# Patient Record
Sex: Female | Born: 1975
Health system: Southern US, Community
[De-identification: ages and names within clinical notes are randomized; demographics above are authoritative.]

## PROBLEM LIST (undated history)

## (undated) DIAGNOSIS — Z8669 Personal history of other diseases of the nervous system and sense organs: Secondary | ICD-10-CM

## (undated) DIAGNOSIS — Z87898 Personal history of other specified conditions: Secondary | ICD-10-CM

## (undated) DIAGNOSIS — M3500 Sicca syndrome, unspecified: Secondary | ICD-10-CM

## (undated) DIAGNOSIS — Z8659 Personal history of other mental and behavioral disorders: Secondary | ICD-10-CM

## (undated) HISTORY — PX: REDUCTION MAMMAPLASTY: SUR839

## (undated) HISTORY — DX: Personal history of other mental and behavioral disorders: Z86.59

## (undated) HISTORY — DX: Personal history of other specified conditions: Z87.898

## (undated) HISTORY — DX: Sjogren syndrome, unspecified: M35.00

## (undated) HISTORY — DX: Personal history of other diseases of the nervous system and sense organs: Z86.69

---

## 2002-11-08 HISTORY — PX: BREAST REDUCTION SURGERY: SHX8

## 2018-09-21 DIAGNOSIS — S5412XA Injury of median nerve at forearm level, left arm, initial encounter: Secondary | ICD-10-CM | POA: Insufficient documentation

## 2018-09-29 ENCOUNTER — Encounter: Payer: Self-pay | Admitting: Infectious Diseases

## 2018-11-09 DIAGNOSIS — H93299 Other abnormal auditory perceptions, unspecified ear: Secondary | ICD-10-CM | POA: Diagnosis not present

## 2018-11-09 DIAGNOSIS — F419 Anxiety disorder, unspecified: Secondary | ICD-10-CM | POA: Diagnosis not present

## 2018-11-09 DIAGNOSIS — Z79899 Other long term (current) drug therapy: Secondary | ICD-10-CM | POA: Diagnosis not present

## 2018-11-09 DIAGNOSIS — F902 Attention-deficit hyperactivity disorder, combined type: Secondary | ICD-10-CM | POA: Diagnosis not present

## 2018-11-09 DIAGNOSIS — F338 Other recurrent depressive disorders: Secondary | ICD-10-CM | POA: Diagnosis not present

## 2018-12-08 DIAGNOSIS — B9789 Other viral agents as the cause of diseases classified elsewhere: Secondary | ICD-10-CM | POA: Diagnosis not present

## 2018-12-08 DIAGNOSIS — J029 Acute pharyngitis, unspecified: Secondary | ICD-10-CM | POA: Diagnosis not present

## 2018-12-08 DIAGNOSIS — J028 Acute pharyngitis due to other specified organisms: Secondary | ICD-10-CM | POA: Diagnosis not present

## 2019-01-23 DIAGNOSIS — N870 Mild cervical dysplasia: Secondary | ICD-10-CM | POA: Diagnosis not present

## 2019-01-23 DIAGNOSIS — N898 Other specified noninflammatory disorders of vagina: Secondary | ICD-10-CM | POA: Diagnosis not present

## 2019-02-26 DIAGNOSIS — F419 Anxiety disorder, unspecified: Secondary | ICD-10-CM | POA: Diagnosis not present

## 2019-02-26 DIAGNOSIS — F902 Attention-deficit hyperactivity disorder, combined type: Secondary | ICD-10-CM | POA: Diagnosis not present

## 2019-02-26 DIAGNOSIS — F338 Other recurrent depressive disorders: Secondary | ICD-10-CM | POA: Diagnosis not present

## 2019-04-30 DIAGNOSIS — N870 Mild cervical dysplasia: Secondary | ICD-10-CM | POA: Diagnosis not present

## 2019-04-30 DIAGNOSIS — N760 Acute vaginitis: Secondary | ICD-10-CM | POA: Diagnosis not present

## 2019-04-30 DIAGNOSIS — Z113 Encounter for screening for infections with a predominantly sexual mode of transmission: Secondary | ICD-10-CM | POA: Diagnosis not present

## 2019-04-30 DIAGNOSIS — N939 Abnormal uterine and vaginal bleeding, unspecified: Secondary | ICD-10-CM | POA: Diagnosis not present

## 2019-04-30 DIAGNOSIS — N898 Other specified noninflammatory disorders of vagina: Secondary | ICD-10-CM | POA: Diagnosis not present

## 2019-05-04 ENCOUNTER — Encounter: Payer: Self-pay | Admitting: Family Medicine

## 2019-05-04 ENCOUNTER — Ambulatory Visit (INDEPENDENT_AMBULATORY_CARE_PROVIDER_SITE_OTHER): Payer: BC Managed Care – PPO | Admitting: Family Medicine

## 2019-05-04 ENCOUNTER — Other Ambulatory Visit: Payer: Self-pay

## 2019-05-04 VITALS — BP 116/78 | HR 77 | Temp 97.8°F | Ht 66.25 in | Wt 166.5 lb

## 2019-05-04 DIAGNOSIS — Z7689 Persons encountering health services in other specified circumstances: Secondary | ICD-10-CM | POA: Diagnosis not present

## 2019-05-04 DIAGNOSIS — D171 Benign lipomatous neoplasm of skin and subcutaneous tissue of trunk: Secondary | ICD-10-CM

## 2019-05-04 DIAGNOSIS — K649 Unspecified hemorrhoids: Secondary | ICD-10-CM

## 2019-05-04 MED ORDER — HYDROCORTISONE ACETATE 25 MG RE SUPP: 25.0000 mg | Freq: Two times a day (BID) | RECTAL | 0 refills | Status: DC

## 2019-05-04 NOTE — Patient Instructions (Addendum)
Good to see you today  I have sent in suppositories to your pharmacy. If no improvement, let me know. Continue good fiber and water intake.   Please call and schedule an appointment for screening mammogram. A referral is not needed.  [place]  [place]- [phone]

## 2019-05-04 NOTE — Progress Notes (Signed)
   Subjective:    Patient ID: Natalie Blair, female    DOB: Feb 01, 1976, 43 y.o.   MRN: [id number]  HPI This is a 43 yo female who presents today to establish care and with complaint of painful hemorrhoids x 10 days. 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   Last CPE- last year Mammo- last year Pap- last week  Eye- regular Dental- regular Exercise- a couple times week Stress- ok Sleep- 6 hours a night, more during weekends  Hemorrhoids- some hard bowel movements prior to hemorrhoids. Started after pregnancy. Has used preparation H, sitz baths, ice, tucks. Bowel movements better. No bleeding. Occurs infrequently.   She has an area of swelling at the base of her neck on the right side. Seems to be getting larger, no drainage, occasional pain if she puts pressure on it.   Past Medical History:  Diagnosis Date  . History of depression   . History of headache   . History of migraine    Past Surgical History:  Procedure Laterality Date  . BREAST REDUCTION SURGERY  2004   Family History  Problem Relation Age of Onset  . Depression Mother   . Heart disease Father   . Hypertension Father   . Stroke Father       Review of Systems Per HPI    Objective:     Physical Exam  Constitutional: She is oriented to person, place, and time. She appears well-developed and well-nourished. No distress.  HENT:  Head: Normocephalic and atraumatic.  Eyes: Conjunctivae are normal.  Cardiovascular: Normal rate.  Pulmonary/Chest: Effort normal.  Genitourinary: Rectum:     External hemorrhoid (approximately 1 cm, flesh colored, firm.) present.   Neurological: She is alert and oriented to person, place, and time.  Skin: Skin is warm and dry. She is not diaphoretic.     Psychiatric: She has a normal mood and affect. Her behavior is normal. Judgment and thought content normal.  Vitals reviewed.    BP 116/78 (BP Location: Left Arm, Patient Position: Sitting, Cuff Size:  Normal)   Pulse 77   Temp 97.8 F (36.6 C) (Temporal)   Ht 5' 6.25" (1.683 m)   Wt 166 lb 8 oz (75.5 kg)   LMP 04/17/2019   SpO2 100%   BMI 26.67 kg/m      Assessment & Plan:  1. Encounter to establish care - will request records from previous provider.  - she was provided information to schedule mammogram  2. Hemorrhoids, unspecified hemorrhoid type - discussed importance of regular, soft bowel movements and suggested improved fiber and water intake.  - hydrocortisone (ANUSOL-HC) 25 MG suppository; Place 1 suppository (25 mg total) rectally 2 (two) times daily.  Dispense: 12 suppository; Refill: 0  3. Lipoma of torso - Discussed options of monitoring or surgery referral to discuss removal. She elects to discuss removal with surgery, referral placed.  - Ambulatory referral to [place], FNP-BC  [facility]  05/08/2019 4:27 PM

## 2019-05-08 ENCOUNTER — Encounter: Payer: Self-pay | Admitting: Family Medicine

## 2019-05-09 ENCOUNTER — Encounter: Payer: Self-pay | Admitting: *Deleted

## 2019-05-09 ENCOUNTER — Encounter: Payer: Self-pay | Admitting: Surgery

## 2019-05-09 ENCOUNTER — Other Ambulatory Visit: Payer: Self-pay

## 2019-05-09 ENCOUNTER — Ambulatory Visit (INDEPENDENT_AMBULATORY_CARE_PROVIDER_SITE_OTHER): Payer: BC Managed Care – PPO | Admitting: Surgery

## 2019-05-09 VITALS — BP 112/68 | HR 75 | Temp 97.8°F | Ht 66.0 in | Wt 168.0 lb

## 2019-05-09 DIAGNOSIS — D170 Benign lipomatous neoplasm of skin and subcutaneous tissue of head, face and neck: Secondary | ICD-10-CM

## 2019-05-09 NOTE — Patient Instructions (Signed)
Lipoma  A lipoma is a noncancerous (benign) tumor that is made up of fat cells. This is a very common type of soft-tissue growth. Lipomas are usually found under the skin (subcutaneous). They may occur in any tissue of the body that contains fat. Common areas for lipomas to appear include the back, shoulders, buttocks, and thighs.  Lipomas grow slowly, and they are usually painless. Most lipomas do not cause problems and do not require treatment. What are the causes? The cause of this condition is not known. What increases the risk? You are more likely to develop this condition if:  You are 40-60 years old.  You have a family history of lipomas. What are the signs or symptoms? A lipoma usually appears as a small, round bump under the skin. In most cases, the lump will:  Feel soft or rubbery.  Not cause pain or other symptoms. However, if a lipoma is located in an area where it pushes on nerves, it can become painful or cause other symptoms. How is this diagnosed? A lipoma can usually be diagnosed with a physical exam. You may also have tests to confirm the diagnosis and to rule out other conditions. Tests may include:  Imaging tests, such as a CT scan or MRI.  Removal of a tissue sample to be looked at under a microscope (biopsy). How is this treated? Treatment for this condition depends on the size of the lipoma and whether it is causing any symptoms.  For small lipomas that are not causing problems, no treatment is needed.  If a lipoma is bigger or it causes problems, surgery may be done to remove the lipoma. Lipomas can also be removed to improve appearance. Most often, the procedure is done after applying a medicine that numbs the area (local anesthetic). Follow these instructions at home:  Watch your lipoma for any changes.  Keep all follow-up visits as told by your health care provider. This is important. Contact a health care provider if:  Your lipoma becomes larger or  hard.  Your lipoma becomes painful, red, or increasingly swollen. These could be signs of infection or a more serious condition. Get help right away if:  You develop tingling or numbness in an area near the lipoma. This could indicate that the lipoma is causing nerve damage. Summary  A lipoma is a noncancerous tumor that is made up of fat cells.  Most lipomas do not cause problems and do not require treatment.  If a lipoma is bigger or it causes problems, surgery may be done to remove the lipoma. This information is not intended to replace advice given to you by your health care provider. Make sure you discuss any questions you have with your health care provider. Document Released: 10/15/2002 Document Revised: 10/11/2017 Document Reviewed: 10/11/2017 Elsevier Patient Education  2020 Elsevier Inc[employer]  

## 2019-05-09 NOTE — Progress Notes (Unsigned)
Patient needs to have an excision of a posterior neck lipoma (6 cm approximate). The patient is concerned about the cost for this. Patient wishes to get a cost estimate of removal O.R. versus office.   Dr. Dahlia Byes would prefer to do in the O.R. If patient wishes to proceed with office excision we will need to allow one hour.   Note routed to Angie to please provise patient with an estimate.

## 2019-05-09 NOTE — Progress Notes (Signed)
Patient ID: Natalie Blair, female   DOB: [birth year], 43 y.o.   MRN: [id number]  HPI Natalie Blair is a 43 y.o. female asked to see in consultation at the request of this is Carlean Purl FNP.  He has had right posterior neck/shoulder mass for the last  Year or so.  She does report increasing in size that has been slowly.  She reports some intermittent mild pain that is dull and achy type of feeling.  No specific alleviating or aggravating factors.  No fevers no chills no evidence of B type symptoms.  No images are available at this time. Is able to perform more than 4 METS of activity without any shortness of breath or chest pain.  She is otherwise healthy    HPI  Past Medical History:  Diagnosis Date  . History of depression   . History of headache   . History of migraine     Past Surgical History:  Procedure Laterality Date  . BREAST REDUCTION SURGERY  2004    Family History  Problem Relation Age of Onset  . Depression Mother   . Heart disease Father   . Hypertension Father   . Stroke Father     Social History Social History   Tobacco Use  . Smoking status: Never Smoker  . Smokeless tobacco: Never Used  Substance Use Topics  . Alcohol use: Yes    Comment: socially/occ  . Drug use: Not Currently    No Known Allergies  Current Outpatient Medications  Medication Sig Dispense Refill  . Ascorbic Acid (VITAMIN C PO) Take 1 capsule by mouth daily.    . Cholecalciferol (VITAMIN D3 PO) Take 1 capsule by mouth daily.    . hydrocortisone (ANUSOL-HC) 25 MG suppository Place 1 suppository (25 mg total) rectally 2 (two) times daily. 12 suppository 0  . Probiotic Product (PROBIOTIC PO) Take 1 capsule by mouth daily.     Marland Kitchen VYVANSE 20 MG capsule Take 1 capsule by mouth daily.     No current facility-administered medications for this visit.      Review of Systems Full ROS  was asked and was negative except for the information on the HPI  Physical Exam Blood pressure 112/68, pulse  75, temperature 97.8 F (36.6 C), temperature source Skin, height 5\' 6"  (1.676 m), weight 168 lb (76.2 kg), last menstrual period 04/17/2019, SpO2 98 %. CONSTITUTIONAL: NAD EYES: Pupils are equal, round, and reactive to light, Sclera are non-icteric. EARS, NOSE, MOUTH AND THROAT: The oropharynx is clear. The oral mucosa is pink and moist. Hearing is intact to voice. LYMPH NODES:  Lymph nodes in the neck are normal. RESPIRATORY:  Lungs are clear. There is normal respiratory effort, with equal breath sounds bilaterally, and without pathologic use of accessory muscles. CARDIOVASCULAR: Heart is regular without murmurs, gallops, or rubs. GI: The abdomen is  soft, nontender, and nondistended. There are no palpable masses. There is no hepatosplenomegaly. There are normal bowel sounds in all quadrants. GU: Rectal deferred.   MUSCULOSKELETAL: Normal muscle strength and tone. No cyanosis or edema.   SKIN: Approximately 6 cm posterior right neck/shoulder mass that is mobile consistent with a deep lipoma  nEUROLOGIC: Motor and sensation is grossly normal. Cranial nerves are grossly intact. PSYCH:  Oriented to person, place and time. Affect is normal.  Data Reviewed  I have personally reviewed the patient's imaging, laboratory findings and medical records.    Assessment/Plan 43 year old female with a large symptomatic posterior neck lipoma.  Given her symptoms  and the increasing size to recommend excision.  Also discussed in detail about options or further imaging versus excision.  And I also stated to the patient that this will probably be ideal to be removed under general anesthetic given the location.  There is a chance that we might be able to do it under local in the office but these will be less than ideal.  Patient is very concerned about the cost of having this procedure done in the OR.  Procedure discussed with the patient in detail.  Risk benefit and possible occasions including but not limited to  bleeding, infection, nerve injuries.  Stands.  Patient will call back and let us know what is her preference, as I stated before I prefer to do this under general anesthetic in a prone position in the OR  A copy of this report was sent to the referring provider  Caroleen Hamman, MD [place] Surgeon 05/09/2019, 3:38 PM

## 2019-05-16 DIAGNOSIS — Z1159 Encounter for screening for other viral diseases: Secondary | ICD-10-CM | POA: Diagnosis not present

## 2019-05-16 DIAGNOSIS — N858 Other specified noninflammatory disorders of uterus: Secondary | ICD-10-CM | POA: Diagnosis not present

## 2019-05-16 DIAGNOSIS — N939 Abnormal uterine and vaginal bleeding, unspecified: Secondary | ICD-10-CM | POA: Diagnosis not present

## 2019-05-21 ENCOUNTER — Telehealth: Payer: Self-pay | Admitting: Surgery

## 2019-05-21 DIAGNOSIS — N920 Excessive and frequent menstruation with regular cycle: Secondary | ICD-10-CM | POA: Diagnosis not present

## 2019-05-21 DIAGNOSIS — N840 Polyp of corpus uteri: Secondary | ICD-10-CM | POA: Diagnosis not present

## 2019-05-21 NOTE — Telephone Encounter (Signed)
I have contacted the patient with estimate.   CPT: 97588 given per Selinda Eon.   Patient has met her deductible. Co insurance is 20% for outpatient and specialty office.   Patient's OOP max remaining is $1,262.67.  Office estimate-$97.97 Outpatient provider estimate-$81.04  Patient made aware that she would be billed for pathology separately if done in the office.   If done as outpatient, she would be billed for hospital, anesthesia and pathology.   She will contact the office to schedule once decision has been made.

## 2019-05-30 ENCOUNTER — Ambulatory Visit (INDEPENDENT_AMBULATORY_CARE_PROVIDER_SITE_OTHER): Payer: BC Managed Care – PPO | Admitting: Family Medicine

## 2019-05-30 ENCOUNTER — Encounter: Payer: Self-pay | Admitting: Family Medicine

## 2019-05-30 VITALS — Ht 66.25 in | Wt 165.0 lb

## 2019-05-30 DIAGNOSIS — Z9889 Other specified postprocedural states: Secondary | ICD-10-CM | POA: Diagnosis not present

## 2019-05-30 DIAGNOSIS — R5383 Other fatigue: Secondary | ICD-10-CM

## 2019-05-30 NOTE — Progress Notes (Signed)
Virtual Visit via Video Note  I connected with Natalie Blair on 05/30/19 at  2:00 PM EDT by a video enabled telemedicine application and verified that I am speaking with the correct person using two identifiers.  Location: Patient: In her home Provider: [place]   I discussed the limitations of evaluation and management by telemedicine and the availability of in person appointments. The patient expressed understanding and agreed to proceed.  History of Present Illness: This is a 43 yo female who requests virtual visit to discuss 3 days of severe fatigue.  She had a uterine ablation 8 days ago.  She reports that she did not have significant bleeding.  She had had a few cramps as expected.  She has not had any fever, headache, sore throat, ear pain, muscle aches or rash.   Past Medical History:  Diagnosis Date  . History of depression   . History of headache   . History of migraine    Past Surgical History:  Procedure Laterality Date  . BREAST REDUCTION SURGERY  2004   Family History  Problem Relation Age of Onset  . Depression Mother   . Heart disease Father   . Hypertension Father   . Stroke Father    Social History   Tobacco Use  . Smoking status: Never Smoker  . Smokeless tobacco: Never Used  Substance Use Topics  . Alcohol use: Yes    Comment: socially/occ  . Drug use: Not Currently    Observations/Objective: Patient is alert and answers questions appropriately.  Visible skin is unremarkable.  She is normally conversive without shortness of breath.  No audible wheeze or witnessed cough.  Mood and affect are appropriate. Ht 5' 6.25" (1.683 m)   Wt 165 lb (74.8 kg)   LMP 05/14/2019   BMI 26.43 kg/m  Wt Readings from Last 3 Encounters:  05/30/19 165 lb (74.8 kg)  05/09/19 168 lb (76.2 kg)  05/04/19 166 lb 8 oz (75.5 kg)    Assessment and Plan: 1. Fatigue, unspecified type - unclear etiology, will check labs, she was instructed to report any symptoms  that develop - CBC with Differential/Platelet; Future - Comprehensive metabolic panel; Future - TSH; Future - Ferritin; Future  2. S/P endometrial ablation - CBC with Differential/Platelet; Future   Natalie Reamer, FNP-BC  [facility]  05/30/2019 2:14 PM   Follow Up Instructions:    I discussed the assessment and treatment plan with the patient. The patient was provided an opportunity to ask questions and all were answered. The patient agreed with the plan and demonstrated an understanding of the instructions.   The patient was advised to call back or seek an in-person evaluation if the symptoms worsen or if the condition fails to improve as anticipated.   Elby Beck, FNP

## 2019-05-31 ENCOUNTER — Other Ambulatory Visit: Payer: BC Managed Care – PPO

## 2019-05-31 DIAGNOSIS — F338 Other recurrent depressive disorders: Secondary | ICD-10-CM | POA: Diagnosis not present

## 2019-05-31 DIAGNOSIS — F419 Anxiety disorder, unspecified: Secondary | ICD-10-CM | POA: Diagnosis not present

## 2019-05-31 DIAGNOSIS — Z79899 Other long term (current) drug therapy: Secondary | ICD-10-CM | POA: Diagnosis not present

## 2019-05-31 DIAGNOSIS — F902 Attention-deficit hyperactivity disorder, combined type: Secondary | ICD-10-CM | POA: Diagnosis not present

## 2019-06-07 DIAGNOSIS — Z30430 Encounter for insertion of intrauterine contraceptive device: Secondary | ICD-10-CM | POA: Diagnosis not present

## 2019-07-23 ENCOUNTER — Ambulatory Visit (INDEPENDENT_AMBULATORY_CARE_PROVIDER_SITE_OTHER): Payer: BC Managed Care – PPO | Admitting: Family Medicine

## 2019-07-23 ENCOUNTER — Encounter: Payer: Self-pay | Admitting: Family Medicine

## 2019-07-23 ENCOUNTER — Other Ambulatory Visit: Payer: Self-pay

## 2019-07-23 VITALS — BP 100/70 | HR 65 | Temp 98.3°F | Ht 66.25 in | Wt 164.5 lb

## 2019-07-23 DIAGNOSIS — H9201 Otalgia, right ear: Secondary | ICD-10-CM

## 2019-07-23 MED ORDER — OFLOXACIN 0.3 % OT SOLN: 10.0000 [drp] | Freq: Every day | OTIC | 0 refills | Status: DC

## 2019-07-23 MED ORDER — PREDNISONE 20 MG PO TABS: ORAL_TABLET | ORAL | 0 refills | Status: DC

## 2019-07-23 NOTE — Progress Notes (Signed)
Natalie Base T. Halley Kincer, MD Primary Care and [place] [facility] [place], [address] Phone: [phone]  FAX: [phone]  Natalie Blair - 43 y.o. female  MRN [id number]  Date of Birth: 1976-07-29  Visit Date: 07/23/2019  PCP: Elby Beck, FNP  Referred by: Elby Beck, FNP  Chief Complaint  Patient presents with  . Jaw Pain    Right x 2 weeks   Subjective:   Natalie Blair is a 43 y.o. very pleasant female patient who presents with the following:  Worse for 2 weeks.  Right pain at the corner of the jaw and getting worse.   The patient presents with a two-week history of pain at the corner of her jaw.  She has pain with opening her jaw and sometimes she does have mechanical click.  She also has pain around the tragus itself.  She has some pain in and around the ear as well but she does not have any maxillary pain or frontal sinus pain. Good tooth care.   Past Medical History, Surgical History, Social History, Family History, Problem List, Medications, and Allergies have been reviewed and updated if relevant.  Patient Active Problem List   Diagnosis Date Noted  . 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 History of depression   . History of headache   . History of migraine     Past Surgical History:  Procedure Laterality Date  . BREAST REDUCTION SURGERY  2004    Social History   Socioeconomic History  . Marital status: Single    Spouse name: Not on file  . Number of children: Not on file  . Years of education: Not on file  . Highest education level: Not on file  Occupational History  . Not on file  Social Needs  . Financial resource strain: Not on file  . Food insecurity    Worry: Not on file    Inability: Not on file  . Transportation needs    Medical: Not on file    Non-medical: Not on file  Tobacco Use  . Smoking  status: Never Smoker  . Smokeless tobacco: Never Used  Substance and Sexual Activity  . Alcohol use: Yes    Comment: socially/occ  . Drug use: Not Currently  . Sexual activity: Not on file  Lifestyle  . Physical activity    Days per week: Not on file    Minutes per session: Not on file  . Stress: Not on file  Relationships  . Social Herbalist on phone: Not on file    Gets together: Not on file    Attends religious service: Not on file    Active member of club or organization: Not on file    Attends meetings of clubs or organizations: Not on file    Relationship status: Not on file  . Intimate partner violence    Fear of current or ex partner: Not on file    Emotionally abused: Not on file    Physically abused: Not on file    Forced sexual activity: Not on file  Other Topics Concern  . Not on file  Social History Narrative  . Not on file    Family History  Problem Relation Age of Onset  . Depression Mother   . Heart disease Father   . Hypertension Father   .  Stroke Father     No Known Allergies  Medication list reviewed and updated in full in [facility].   GEN: No acute illnesses, no fevers, chills. GI: No n/v/d, eating normally Pulm: No SOB Interactive and getting along well at home.  Otherwise, ROS is as per the HPI.  Objective:   BP 100/70   Pulse 65   Temp 98.3 F (36.8 C) (Temporal)   Ht 5' 6.25" (1.683 m)   Wt 164 lb 8 oz (74.6 kg)   SpO2 98%   BMI 26.35 kg/m   GEN: WDWN, NAD, Non-toxic, A & O x 3 HEENT: Atraumatic, Normocephalic. Neck supple. No masses, No LAD. Some pain on R side with opening the jaw. Ears and Nose: No external deformity.  TMs are clear bilaterally.  There is tenderness to palpation at the tragus on the right.  No cervical lymphadenopathy EXTR: No c/c/e NEURO Normal gait.  PSYCH: Normally interactive. Conversant. Not depressed or anxious appearing.  Calm demeanor.   Laboratory and Imaging Data:   Assessment and Plan:     ICD-10-CM   1. Ear pain, right  H92.01    Ear pain, unclear origin.  This may be a otitis externa, and she also has some pain at the TMJ, so I am to treat her for both.  Follow-up: No follow-ups on file.  Meds ordered this encounter  Medications  . predniSONE (DELTASONE) 20 MG tablet    Sig: 2 tabs po daily for 4 days, then 1 tab po daily for 3 days    Dispense:  11 tablet    Refill:  0  . DISCONTD: ofloxacin (FLOXIN) 0.3 % OTIC solution    Sig: Place 10 drops into the right ear daily.    Dispense:  5 mL    Refill:  0  . ofloxacin (FLOXIN) 0.3 % OTIC solution    Sig: Place 10 drops into the right ear daily.    Dispense:  5 mL    Refill:  0   No orders of the defined types were placed in this encounter.   Signed,  Maud Deed. Aquanetta Schwarz, MD   Outpatient Encounter Medications as of 07/23/2019  Medication Sig  . Ascorbic Acid (VITAMIN C PO) Take 1 capsule by mouth daily.  . Cholecalciferol (VITAMIN D3 PO) Take 1 capsule by mouth daily.  Marland Kitchen ibuprofen (ADVIL) 600 MG tablet Take 600 mg by mouth every 6 (six) hours as needed.  . Probiotic Product (PROBIOTIC PO) Take 1 capsule by mouth daily.   Marland Kitchen VYVANSE 20 MG capsule Take 1 capsule by mouth daily.  Marland Kitchen ofloxacin (FLOXIN) 0.3 % OTIC solution Place 10 drops into the right ear daily.  . predniSONE (DELTASONE) 20 MG tablet 2 tabs po daily for 4 days, then 1 tab po daily for 3 days  . [DISCONTINUED] ofloxacin (FLOXIN) 0.3 % OTIC solution Place 10 drops into the right ear daily.  . [DISCONTINUED] oxyCODONE-acetaminophen (PERCOCET/ROXICET) 5-325 MG tablet Take 1 tablet by mouth every 6 (six) hours as needed. for pain   No facility-administered encounter medications on file as of 07/23/2019.

## 2019-08-04 DIAGNOSIS — Z20828 Contact with and (suspected) exposure to other viral communicable diseases: Secondary | ICD-10-CM | POA: Diagnosis not present

## 2019-08-04 DIAGNOSIS — R05 Cough: Secondary | ICD-10-CM | POA: Diagnosis not present

## 2019-09-05 ENCOUNTER — Other Ambulatory Visit: Payer: Self-pay

## 2019-09-05 ENCOUNTER — Ambulatory Visit (INDEPENDENT_AMBULATORY_CARE_PROVIDER_SITE_OTHER): Payer: BC Managed Care – PPO | Admitting: Surgery

## 2019-09-05 ENCOUNTER — Encounter: Payer: Self-pay | Admitting: Surgery

## 2019-09-05 VITALS — BP 108/76 | HR 82 | Temp 97.3°F | Ht 67.0 in | Wt 164.8 lb

## 2019-09-05 DIAGNOSIS — D171 Benign lipomatous neoplasm of skin and subcutaneous tissue of trunk: Secondary | ICD-10-CM

## 2019-09-05 NOTE — Patient Instructions (Signed)
Follow up in two weeks with a virtual phone visit.   Lipoma Removal, Care After Refer to this sheet in the next few weeks. These instructions provide you with information about caring for yourself after your procedure. Your health care provider may also give you more specific instructions. Your treatment has been planned according to current medical practices, but problems sometimes occur. Call your health care provider if you have any problems or questions after your procedure. What can I expect after the procedure? After the procedure, it is common to have:  Mild pain.  Swelling.  Bruising. Follow these instructions at home:  Bathing  Do not take baths, swim, or use a hot tub until your health care provider approves. Ask your health care provider if you can take showers. You may only be allowed to take sponge baths for bathing.  Keep your bandage (dressing) dry until your health care provider says it can be removed. Incision care   Follow instructions from your health care provider about how to take care of your incision. Make sure you: ? Wash your hands with soap and water before you change your bandage (dressing). If soap and water are not available, use hand sanitizer. ? Change your dressing as told by your health care provider. ? Leave stitches (sutures), skin glue, or adhesive strips in place. These skin closures may need to stay in place for 2 weeks or longer. If adhesive strip edges start to loosen and curl up, you may trim the loose edges. Do not remove adhesive strips completely unless your health care provider tells you to do that.  Check your incision area every day for signs of infection. Check for: ? More redness, swelling, or pain. ? Fluid or blood. ? Warmth. ? Pus or a bad smell. Driving  Do not drive or operate heavy machinery while taking prescription pain medicine.  Do not drive for 24 hours if you received a medicine to help you relax (sedative) during your  procedure.  Ask your health care provider when it is safe for you to drive. General instructions  Take over-the-counter and prescription medicines only as told by your health care provider.  Do not use any tobacco products, such as cigarettes, chewing tobacco, and e-cigarettes. These can delay healing. If you need help quitting, ask your health care provider.  Return to your normal activities as told by your health care provider. Ask your health care provider what activities are safe for you.  Keep all follow-up visits as told by your health care provider. This is important. Contact a health care provider if:  You have more redness, swelling, or pain around your incision.  You have fluid or blood coming from your incision.  Your incision feels warm to the touch.  You have pus or a bad smell coming from your incision.  You have pain that does not get better with medicine. Get help right away if:  You have chills or a fever.  You have severe pain. This information is not intended to replace advice given to you by your health care provider. Make sure you discuss any questions you have with your health care provider. Document Released: 01/08/2016 Document Revised: 06/17/2016 Document Reviewed: 01/08/2016 Elsevier Patient Education  2020 [employer].

## 2019-09-10 ENCOUNTER — Telehealth: Payer: Self-pay | Admitting: *Deleted

## 2019-09-10 NOTE — Telephone Encounter (Signed)
Per Dr.Pabon advised me to contact the patient to inform her that she may take Benadryl 25 mg by mouth every 8 hours. Also, advised me to notify patient that if that does not help she may give our office a call to get her scheduled for a follow up appointment this week with him. Patient verbalizes understanding.

## 2019-09-10 NOTE — Progress Notes (Signed)
Procedure Note 1. Excision of Lipoma Right posterior neck Deep    Anesthesia: lidocaine 1% w Epi  Findings: large posterior Right deep neck lipoma 5 cm  Complications: none  After taking informed consent patient was placed in a prone position and prepped and draped in the usual sterile fashion.  Using lidocaine 1% with epinephrine we infiltrated the subcutaneous tissue and the mass.  Patient was created with a 15 blade knife and we dissected through subcutaneous tissue.  Please note that this was a deep posterior neck right soft tissue mass.  I placed a self-retaining retractor to have better exposure and visualization.  Were able to incised the fascia and dissected the lipoma from surrounding structures with Metzenbaum scissors.  Hemostasis was controlled with pressure.  The fascia was closed with a running 3-0 Vicryl and the skin was closed with a 4-0 Monocryl in a subcuticular fashion.  Dermabond was placed.  No complications

## 2019-09-10 NOTE — Telephone Encounter (Signed)
Patient had a procedure done in the office on 09/05/19 by Dr.Pabon, had a lipoma removed on back, she stated that her back, neck and shoulders breaking out in hives, started Saturday, taking tylenol and ibuprofen, She stated that she thinks maybe its coming from the glue or stitches

## 2019-09-19 ENCOUNTER — Other Ambulatory Visit: Payer: Self-pay

## 2019-09-19 ENCOUNTER — Telehealth (INDEPENDENT_AMBULATORY_CARE_PROVIDER_SITE_OTHER): Payer: BC Managed Care – PPO | Admitting: Surgery

## 2019-09-19 DIAGNOSIS — Z09 Encounter for follow-up examination after completed treatment for conditions other than malignant neoplasm: Secondary | ICD-10-CM

## 2019-09-19 NOTE — Progress Notes (Signed)
Telemedicine Surgical Follow Up  09/19/2019  Natalie Blair is an 43 y.o. female.   No chief complaint on file.   HPI:  Location of the patient: Home Time spent on call: 5 minutes Total Time spent in the encounter including counseling and coordination of care: 5 Minutes Location of the Provider: Office The patient had given consent for a telemedicine visit and they understands the limitations associated with this including but not limited to privacy, cyber security and technology issues. Persons participating in the visit: pt S/p excision post neck deep lipoma, path d/w pt. Some swelling. No fevers or chills. dermabond peeled off   Past Medical History:  Diagnosis Date  . History of depression   . History of headache   . History of migraine     Past Surgical History:  Procedure Laterality Date  . BREAST REDUCTION SURGERY  2004    Family History  Problem Relation Age of Onset  . Depression Mother   . Heart disease Father   . Hypertension Father   . Stroke Father     Social History:  reports that she has never smoked. She has never used smokeless tobacco. She reports current alcohol use. She reports previous drug use.  Allergies: No Known Allergies  Medications reviewed.    ROS Full ROS performed and is otherwise negative other than what is stated in HPI   Assessment/Plan: Doing well, no surgical issues The pt was provided an opportunity to ask questions and all were answered. The pt was advised to call back or seek in-person evaluation if the symptoms worsen or if the condition fails to improve as anticipated. Greater than 50% of the 5 minutes  visit was spent in counseling/coordination of care   Caroleen Hamman, MD [facility]

## 2019-10-26 DIAGNOSIS — Z79899 Other long term (current) drug therapy: Secondary | ICD-10-CM | POA: Diagnosis not present

## 2019-10-26 DIAGNOSIS — F902 Attention-deficit hyperactivity disorder, combined type: Secondary | ICD-10-CM | POA: Diagnosis not present

## 2019-12-19 ENCOUNTER — Ambulatory Visit (INDEPENDENT_AMBULATORY_CARE_PROVIDER_SITE_OTHER): Payer: 59 | Admitting: Family Medicine

## 2019-12-19 ENCOUNTER — Encounter: Payer: Self-pay | Admitting: Family Medicine

## 2019-12-19 ENCOUNTER — Other Ambulatory Visit: Payer: Self-pay

## 2019-12-19 VITALS — BP 102/62 | HR 80 | Temp 97.1°F | Ht 67.0 in | Wt 167.8 lb

## 2019-12-19 DIAGNOSIS — Z1231 Encounter for screening mammogram for malignant neoplasm of breast: Secondary | ICD-10-CM

## 2019-12-19 DIAGNOSIS — R5383 Other fatigue: Secondary | ICD-10-CM | POA: Diagnosis not present

## 2019-12-19 DIAGNOSIS — F329 Major depressive disorder, single episode, unspecified: Secondary | ICD-10-CM

## 2019-12-19 DIAGNOSIS — Z0000 Encounter for general adult medical examination without abnormal findings: Secondary | ICD-10-CM

## 2019-12-19 DIAGNOSIS — F32A Depression, unspecified: Secondary | ICD-10-CM

## 2019-12-19 DIAGNOSIS — F419 Anxiety disorder, unspecified: Secondary | ICD-10-CM

## 2019-12-19 DIAGNOSIS — L819 Disorder of pigmentation, unspecified: Secondary | ICD-10-CM

## 2019-12-19 LAB — CBC WITH DIFFERENTIAL/PLATELET
Basophils Absolute: 0 10*3/uL (ref 0.0–0.1)
Basophils Relative: 0.5 % (ref 0.0–3.0)
Eosinophils Absolute: 0 10*3/uL (ref 0.0–0.7)
Eosinophils Relative: 0.3 % (ref 0.0–5.0)
HCT: 42.8 % (ref 36.0–46.0)
Hemoglobin: 14.4 g/dL (ref 12.0–15.0)
Lymphocytes Relative: 44.9 % (ref 12.0–46.0)
Lymphs Abs: 1.9 10*3/uL (ref 0.7–4.0)
MCHC: 33.8 g/dL (ref 30.0–36.0)
MCV: 94.1 fl (ref 78.0–100.0)
Monocytes Absolute: 0.3 10*3/uL (ref 0.1–1.0)
Monocytes Relative: 7.6 % (ref 3.0–12.0)
Neutro Abs: 2 10*3/uL (ref 1.4–7.7)
Neutrophils Relative %: 46.7 % (ref 43.0–77.0)
Platelets: 239 10*3/uL (ref 150.0–400.0)
RBC: 4.55 Mil/uL (ref 3.87–5.11)
RDW: 12.7 % (ref 11.5–15.5)
WBC: 4.2 10*3/uL (ref 4.0–10.5)

## 2019-12-19 LAB — COMPREHENSIVE METABOLIC PANEL
ALT: 7 U/L (ref 0–35)
AST: 12 U/L (ref 0–37)
Albumin: 4.2 g/dL (ref 3.5–5.2)
Alkaline Phosphatase: 39 U/L (ref 39–117)
BUN: 9 mg/dL (ref 6–23)
CO2: 28 mEq/L (ref 19–32)
Calcium: 8.8 mg/dL (ref 8.4–10.5)
Chloride: 101 mEq/L (ref 96–112)
Creatinine, Ser: 0.88 mg/dL (ref 0.40–1.20)
GFR: 83.68 mL/min (ref >60.00)
Glucose, Bld: 84 mg/dL (ref 70–99)
Potassium: 3.7 mEq/L (ref 3.5–5.1)
Sodium: 134 mEq/L — ABNORMAL LOW (ref 135–145)
Total Bilirubin: 0.5 mg/dL (ref 0.2–1.2)
Total Protein: 7.3 g/dL (ref 6.0–8.3)

## 2019-12-19 LAB — FERRITIN: Ferritin: 34.9 ng/mL (ref 10.0–291.0)

## 2019-12-19 LAB — LIPID PANEL
Cholesterol: 188 mg/dL (ref 0–200)
HDL: 64.9 mg/dL (ref >39.00)
LDL Cholesterol: 115 mg/dL — ABNORMAL HIGH (ref 0–99)
NonHDL: 123.17
Total CHOL/HDL Ratio: 3
Triglycerides: 42 mg/dL (ref 0.0–149.0)
VLDL: 8.4 mg/dL (ref 0.0–40.0)

## 2019-12-19 LAB — TSH: TSH: 1.2 u[IU]/mL (ref 0.35–4.50)

## 2019-12-19 LAB — VITAMIN D 25 HYDROXY (VIT D DEFICIENCY, FRACTURES): VITD: 46.64 ng/mL (ref 30.00–100.00)

## 2019-12-19 NOTE — Progress Notes (Signed)
Subjective:    Patient ID: Natalie Blair, female    DOB: 02-Sep-1976, 44 y.o.   MRN: [id number]  HPI Chief Complaint  Patient presents with  . Annual Exam    Decreased energy levels for several months - would like labs checked. / Increased migraines for past few months, 3-4 a month    Last CPE- greater than 1 year ago Mammo-  overdue Pap-per gyn, 03/2017, negative per patient Tdap- declines Flu- declines Exercise- home exercise 3/x week. HIIT. Zoom training.  Sleep- 5-6 hours a night. Napping during the day for 1-2 hours. Sometimes staying up all night.   Fatigue- over last several months. No known trigger.   Decreased appetite. Has to force herself to eat.  No abdominal pain, nausea or vomiting.  She does have intermittent constipation which is not new for her.  Depression and anxiety- PHQ9 today with score of 14.  She reports significant increase in anxiety due to Covid.  She is working from home.  Her son alternates living with her every other month.  He is getting ready to go back to live with his father and this makes her feel more depressed.  She reports that she was previously on medication for depression when going through her divorce.  She does not remember what medication she used but did not like the side effects she had.  She has a therapist in [place] who she can call for virtual appointments.  She has not spoken with her therapist in a while.  She is willing to reach out to her therapist again.  The last couple of weeks she has resumed exercise which is helping with her anxiety.  She continues to see her boyfriend although she does not see him when her son is in town.  She reports that the relationship is good.   Review of Systems  Constitutional: Positive for fatigue.  HENT: Negative.   Eyes: Negative.   Respiratory: Negative.   Cardiovascular: Negative.   Gastrointestinal: Negative.   Endocrine: Negative.   Genitourinary: Negative.   Musculoskeletal: Negative.     Skin: Positive for color change.  Allergic/Immunologic: Negative.   Neurological: Positive for headaches.  Psychiatric/Behavioral: Positive for dysphoric mood and sleep disturbance. The patient is nervous/anxious.        Objective:   Physical Exam Vitals reviewed.  Constitutional:      General: She is not in acute distress.    Appearance: Normal appearance. She is normal weight. She is not ill-appearing, toxic-appearing or diaphoretic.  HENT:     Head: Normocephalic and atraumatic.  Eyes:     Conjunctiva/sclera: Conjunctivae normal.  Cardiovascular:     Rate and Rhythm: Normal rate and regular rhythm.     Pulses: Normal pulses.     Heart sounds: Normal heart sounds.  Pulmonary:     Effort: Pulmonary effort is normal.     Breath sounds: Normal breath sounds.  Chest:     Breasts:        Right: Normal.        Left: Normal.     Comments: Well-healed bilateral mammoplasty scars. Abdominal:     General: Abdomen is flat. There is no distension.     Palpations: Abdomen is soft. There is no mass.     Tenderness: There is no abdominal tenderness. There is no guarding or rebound.     Hernia: No hernia is present.  Musculoskeletal:        General: Normal range of motion.  Cervical back: Normal range of motion and neck supple.     Right lower leg: No edema.     Left lower leg: No edema.  Lymphadenopathy:     Upper Body:     Right upper body: No supraclavicular, axillary or pectoral adenopathy.     Left upper body: No supraclavicular, axillary or pectoral adenopathy.  Skin:    General: Skin is warm and dry.     Comments: Multiple areas of hyperpigmentation. Left upper arm-slightly raised, round.   Right breast-flat lesion below the nipple, also smaller lesion upper left quadrant. Few scattered hyperpigmented areas on arms.   Neurological:     Mental Status: She is alert and oriented to person, place, and time.  Psychiatric:        Mood and Affect: Mood normal.         Behavior: Behavior normal.        Thought Content: Thought content normal.        Judgment: Judgment normal.       BP 102/62 (BP Location: Left Arm, Patient Position: Sitting, Cuff Size: Normal)   Pulse 80   Temp (!) 97.1 F (36.2 C) (Temporal)   Ht 5\' 7"  (1.702 m)   Wt 167 lb 12.8 oz (76.1 kg)   SpO2 99%   BMI 26.28 kg/m  Wt Readings from Last 3 Encounters:  12/19/19 167 lb 12.8 oz (76.1 kg)  09/05/19 164 lb 12.8 oz (74.8 kg)  07/23/19 164 lb 8 oz (74.6 kg)   Depression screen PHQ 2/9 12/19/2019  Down, Depressed, Hopeless 3  PHQ - 2 Score 3  Altered sleeping 2  Tired, decreased energy 3  Change in appetite 2  Feeling bad or failure about yourself  1  Trouble concentrating 3  Moving slowly or fidgety/restless 0  Suicidal thoughts 0  PHQ-9 Score 14  Difficult doing work/chores Very difficult        Assessment & Plan:  1. Annual physical exam - Discussed and encouraged healthy lifestyle choices- adequate sleep, regular exercise, stress management and healthy food choices.   2. Fatigue, unspecified type - Ferritin - TSH - CBC with Differential - Comprehensive metabolic panel - Vitamin D, 25-hydroxy - Lipid Panel  3. Encounter for screening mammogram for malignant neoplasm of breast - MM 3D SCREEN BREAST BILATERAL; Future  4. Change in multiple pigmented skin lesions - Ambulatory referral to Dermatology  5. Anxiety and depression - provided written and verbal information regarding sleep, encouraged regular sleep schedule and to limit napping, exercise, healthy food choices. She is not interested in medication at this time, I encouraged her to reach out to her therapist and she was agreeable.  - Follow up in 2-3 months, sooner if symptoms worsen  This visit occurred during the SARS-CoV-2 public health emergency.  Safety protocols were in place, including screening questions prior to the visit, additional usage of staff PPE, and extensive cleaning of exam room  while observing appropriate contact time as indicated for disinfecting solutions.    Clarene Reamer, FNP-BC  [facility] at [facility], [facility]  12/19/2019 1:54 PM

## 2019-12-19 NOTE — Patient Instructions (Addendum)
Please call and schedule an appointment for screening mammogram.[place] [phone]  Please schedule a virtual visit for 3 months  How to help anxiety and depression   1) Regular Exercise - walking, jogging, cycling, dancing, strength training - aiming for 150 minutes of exercise a week -- Yoga has been shown in research to reduce depression and anxiety -- with even just one hour long session per week -- Walk leisurely for 30 minutes every day  2)  Begin a Mindfulness/Meditation practice -- this can take a little as 3 minutes and is helpful for all kinds of mood issues -- You can find resources in books -- Or you can download apps like  -- Headspace App  -- Calm  -- Insignt Timer -- Stop, Breathe & Think   # With each of these Apps - you should decline the "start free trial" offer and as you search through the App should be able to access some of their free content. You can also chose to pay for the content if you find one that works well for you.    # Many of them also offer sleep specific content which may help with insomnia   3) Healthy Diet - Avoid fast foods and processed foods, eat mostly lean proteins, vegetables, fruits and whole grains -- Avoid or decrease Caffeine -- Avoid or decrease Alcohol -- Drink plenty of water, have a balanced diet -- Avoid cigarettes and marijuana (as well as other recreational drugs)    Health Maintenance, Female Adopting a healthy lifestyle and getting preventive care are important in promoting health and wellness. Ask your health care provider about:  The right schedule for you to have regular tests and exams.  Things you can do on your own to prevent diseases and keep yourself healthy. What should I know about diet, weight, and exercise? Eat a healthy diet   Eat a diet that includes plenty of vegetables, fruits, low-fat dairy products, and lean protein.  Do not eat a lot of foods that are high  in solid fats, added sugars, or sodium. Maintain a healthy weight Body mass index (BMI) is used to identify weight problems. It estimates body fat based on height and weight. Your health care provider can help determine your BMI and help you achieve or maintain a healthy weight. Get regular exercise Get regular exercise. This is one of the most important things you can do for your health. Most adults should:  Exercise for at least 150 minutes each week. The exercise should increase your heart rate and make you sweat (moderate-intensity exercise).  Do strengthening exercises at least twice a week. This is in addition to the moderate-intensity exercise.  Spend less time sitting. Even light physical activity can be beneficial. Watch cholesterol and blood lipids Have your blood tested for lipids and cholesterol at 46 years of age, then have this test every 5 years. Have your cholesterol levels checked more often if:  Your lipid or cholesterol levels are high.  You are older than 46 years of age.  You are at high risk for heart disease. What should I know about cancer screening? Depending on your health history and family history, you may need to have cancer screening at various ages. This may include screening for:  Breast cancer.  Cervical cancer.  Colorectal cancer.  Skin cancer.  Lung cancer. What should I know about heart disease, diabetes, and high blood pressure? Blood pressure and heart disease  High blood pressure causes  heart disease and increases the risk of stroke. This is more likely to develop in people who have high blood pressure readings, are of African descent, or are overweight.  Have your blood pressure checked: ? Every 3-5 years if you are 41-36 years of age. ? Every year if you are 34 years old or older. Diabetes Have regular diabetes screenings. This checks your fasting blood sugar level. Have the screening done:  Once every three years after age 16 if you  are at a normal weight and have a low risk for diabetes.  More often and at a younger age if you are overweight or have a high risk for diabetes. What should I know about preventing infection? Hepatitis B If you have a higher risk for hepatitis B, you should be screened for this virus. Talk with your health care provider to find out if you are at risk for hepatitis B infection. Hepatitis C Testing is recommended for:  Everyone born from [birth year] through 1967.  Anyone with known risk factors for hepatitis C. Sexually transmitted infections (STIs)  Get screened for STIs, including gonorrhea and chlamydia, if: ? You are sexually active and are younger than 46 years of age. ? You are older than 46 years of age and your health care provider tells you that you are at risk for this type of infection. ? Your sexual activity has changed since you were last screened, and you are at increased risk for chlamydia or gonorrhea. Ask your health care provider if you are at risk.  Ask your health care provider about whether you are at high risk for HIV. Your health care provider may recommend a prescription medicine to help prevent HIV infection. If you choose to take medicine to prevent HIV, you should first get tested for HIV. You should then be tested every 3 months for as long as you are taking the medicine. Pregnancy  If you are about to stop having your period (premenopausal) and you may become pregnant, seek counseling before you get pregnant.  Take 400 to 800 micrograms (mcg) of folic acid every day if you become pregnant.  Ask for birth control (contraception) if you want to prevent pregnancy. Osteoporosis and menopause Osteoporosis is a disease in which the bones lose minerals and strength with aging. This can result in bone fractures. If you are 37 years old or older, or if you are at risk for osteoporosis and fractures, ask your health care provider if you should:  Be screened for bone  loss.  Take a calcium or vitamin D supplement to lower your risk of fractures.  Be given hormone replacement therapy (HRT) to treat symptoms of menopause. Follow these instructions at home: Lifestyle  Do not use any products that contain nicotine or tobacco, such as cigarettes, e-cigarettes, and chewing tobacco. If you need help quitting, ask your health care provider.  Do not use street drugs.  Do not share needles.  Ask your health care provider for help if you need support or information about quitting drugs. Alcohol use  Do not drink alcohol if: ? Your health care provider tells you not to drink. ? You are pregnant, may be pregnant, or are planning to become pregnant.  If you drink alcohol: ? Limit how much you use to 0-1 drink a day. ? Limit intake if you are breastfeeding.  Be aware of how much alcohol is in your drink. In the [place] one drink equals one 12 oz bottle of beer (355 mL),  one 5 oz glass of wine (148 mL), or one 1 oz glass of hard liquor (44 mL). General instructions  Schedule regular health, dental, and eye exams.  Stay current with your vaccines.  Tell your health care provider if: ? You often feel depressed. ? You have ever been abused or do not feel safe at home. Summary  Adopting a healthy lifestyle and getting preventive care are important in promoting health and wellness.  Follow your health care provider's instructions about healthy diet, exercising, and getting tested or screened for diseases.  Follow your health care provider's instructions on monitoring your cholesterol and blood pressure. This information is not intended to replace advice given to you by your health care provider. Make sure you discuss any questions you have with your health care provider. Document Revised: 10/18/2018 Document Reviewed: 10/18/2018 Elsevier Patient Education  2020 [employer].

## 2019-12-24 ENCOUNTER — Telehealth: Payer: Self-pay | Admitting: *Deleted

## 2019-12-24 NOTE — Telephone Encounter (Signed)
Patient left a voicemail requesting a call back with her lab results. Patient stated that she is unable to get her results from mychart.

## 2019-12-25 NOTE — Progress Notes (Signed)
ATC , no answer, no voicemail

## 2019-12-25 NOTE — Telephone Encounter (Signed)
ATC , no answer, no voicemail

## 2019-12-25 NOTE — Telephone Encounter (Signed)
Pt left v/m returning call and requesting cb. 

## 2019-12-26 ENCOUNTER — Ambulatory Visit
Admission: RE | Admit: 2019-12-26 | Discharge: 2019-12-26 | Disposition: A | Discharge status: Home / Self Care | Payer: 59 | Source: Ambulatory Visit | Attending: Family Medicine | Admitting: Family Medicine

## 2019-12-26 DIAGNOSIS — Z1231 Encounter for screening mammogram for malignant neoplasm of breast: Secondary | ICD-10-CM | POA: Insufficient documentation

## 2019-12-28 NOTE — Telephone Encounter (Signed)
Results reviewed with patient, expressed understanding.    Declined scheduling at this time. Will call back later to schedule.    Nothing further needed.

## 2020-01-10 ENCOUNTER — Other Ambulatory Visit: Payer: Self-pay | Admitting: Family Medicine

## 2020-01-10 DIAGNOSIS — R928 Other abnormal and inconclusive findings on diagnostic imaging of breast: Secondary | ICD-10-CM

## 2020-07-18 DIAGNOSIS — F419 Anxiety disorder, unspecified: Secondary | ICD-10-CM | POA: Diagnosis not present

## 2020-07-18 DIAGNOSIS — F902 Attention-deficit hyperactivity disorder, combined type: Secondary | ICD-10-CM | POA: Diagnosis not present

## 2020-07-18 DIAGNOSIS — Z79899 Other long term (current) drug therapy: Secondary | ICD-10-CM | POA: Diagnosis not present

## 2020-07-18 DIAGNOSIS — F338 Other recurrent depressive disorders: Secondary | ICD-10-CM | POA: Diagnosis not present

## 2020-09-12 DIAGNOSIS — D259 Leiomyoma of uterus, unspecified: Secondary | ICD-10-CM | POA: Diagnosis not present

## 2020-09-12 DIAGNOSIS — N800 Endometriosis of uterus: Secondary | ICD-10-CM | POA: Diagnosis not present

## 2020-09-12 DIAGNOSIS — N9985 Post endometrial ablation syndrome: Secondary | ICD-10-CM | POA: Diagnosis not present

## 2020-09-12 DIAGNOSIS — N939 Abnormal uterine and vaginal bleeding, unspecified: Secondary | ICD-10-CM | POA: Diagnosis not present

## 2020-09-25 DIAGNOSIS — Z30431 Encounter for routine checking of intrauterine contraceptive device: Secondary | ICD-10-CM | POA: Diagnosis not present

## 2020-09-25 DIAGNOSIS — N939 Abnormal uterine and vaginal bleeding, unspecified: Secondary | ICD-10-CM | POA: Diagnosis not present

## 2020-09-26 DIAGNOSIS — N939 Abnormal uterine and vaginal bleeding, unspecified: Secondary | ICD-10-CM | POA: Diagnosis not present

## 2020-09-26 DIAGNOSIS — N800 Endometriosis of uterus: Secondary | ICD-10-CM | POA: Diagnosis not present

## 2020-09-26 DIAGNOSIS — D259 Leiomyoma of uterus, unspecified: Secondary | ICD-10-CM | POA: Diagnosis not present

## 2020-09-26 DIAGNOSIS — N9985 Post endometrial ablation syndrome: Secondary | ICD-10-CM | POA: Diagnosis not present

## 2020-09-29 ENCOUNTER — Ambulatory Visit (INDEPENDENT_AMBULATORY_CARE_PROVIDER_SITE_OTHER): Payer: BC Managed Care – PPO | Admitting: Family Medicine

## 2020-09-29 ENCOUNTER — Encounter: Payer: Self-pay | Admitting: Family Medicine

## 2020-09-29 ENCOUNTER — Other Ambulatory Visit: Payer: Self-pay

## 2020-09-29 VITALS — BP 106/70 | HR 81 | Temp 97.3°F | Wt 167.0 lb

## 2020-09-29 DIAGNOSIS — L509 Urticaria, unspecified: Secondary | ICD-10-CM | POA: Diagnosis not present

## 2020-09-29 DIAGNOSIS — R5383 Other fatigue: Secondary | ICD-10-CM | POA: Diagnosis not present

## 2020-09-29 DIAGNOSIS — R768 Other specified abnormal immunological findings in serum: Secondary | ICD-10-CM | POA: Diagnosis not present

## 2020-09-29 LAB — CBC WITH DIFFERENTIAL/PLATELET
Basophils Absolute: 0 10*3/uL (ref 0.0–0.1)
Basophils Relative: 0.4 % (ref 0.0–3.0)
Eosinophils Absolute: 0 10*3/uL (ref 0.0–0.7)
Eosinophils Relative: 1.1 % (ref 0.0–5.0)
HCT: 40 % (ref 36.0–46.0)
Hemoglobin: 13.8 g/dL (ref 12.0–15.0)
Lymphocytes Relative: 45.9 % (ref 12.0–46.0)
Lymphs Abs: 2 10*3/uL (ref 0.7–4.0)
MCHC: 34.5 g/dL (ref 30.0–36.0)
MCV: 93 fl (ref 78.0–100.0)
Monocytes Absolute: 0.3 10*3/uL (ref 0.1–1.0)
Monocytes Relative: 7.3 % (ref 3.0–12.0)
Neutro Abs: 1.9 10*3/uL (ref 1.4–7.7)
Neutrophils Relative %: 45.3 % (ref 43.0–77.0)
Platelets: 213 10*3/uL (ref 150.0–400.0)
RBC: 4.29 Mil/uL (ref 3.87–5.11)
RDW: 13.1 % (ref 11.5–15.5)
WBC: 4.3 10*3/uL (ref 4.0–10.5)

## 2020-09-29 LAB — HIGH SENSITIVITY CRP: CRP, High Sensitivity: 1.39 mg/L (ref 0.000–5.000)

## 2020-09-29 LAB — COMPREHENSIVE METABOLIC PANEL
ALT: 7 U/L (ref 0–35)
AST: 13 U/L (ref 0–37)
Albumin: 3.9 g/dL (ref 3.5–5.2)
Alkaline Phosphatase: 29 U/L — ABNORMAL LOW (ref 39–117)
BUN: 14 mg/dL (ref 6–23)
CO2: 27 mEq/L (ref 19–32)
Calcium: 8.3 mg/dL — ABNORMAL LOW (ref 8.4–10.5)
Chloride: 105 mEq/L (ref 96–112)
Creatinine, Ser: 0.85 mg/dL (ref 0.40–1.20)
GFR: 81.92 mL/min (ref >60.00)
Glucose, Bld: 79 mg/dL (ref 70–99)
Potassium: 4.3 mEq/L (ref 3.5–5.1)
Sodium: 136 mEq/L (ref 135–145)
Total Bilirubin: 0.3 mg/dL (ref 0.2–1.2)
Total Protein: 6.6 g/dL (ref 6.0–8.3)

## 2020-09-29 LAB — TSH: TSH: 0.7 u[IU]/mL (ref 0.35–4.50)

## 2020-09-29 NOTE — Patient Instructions (Signed)
Zyrtec, Tagamet (generic is fine) for hives- take daily for 14 days if desired to see if improvement Keep a log of episodes   Hives Hives are itchy, red, swollen areas on your skin. Hives can show up on any part of your body. Hives often fade within 24 hours (acute hives). New hives can show up after old ones fade. This can go on for many days or weeks (chronic hives). Hives do not spread from person to person (are not contagious). Hives are caused by your body's response to something that you are allergic to (allergen). These are sometimes called triggers. You can get hives right after being around a trigger, or hours later. What are the causes?  Allergies to foods.  Insect bites or stings.  Pollen.  Pets.  Latex.  Chemicals.  Spending time in sunlight, heat, or cold.  Exercise.  Stress.  Some medicines.  Viruses. This includes the common cold.  Infections caused by germs (bacteria).  Allergy shots.  Blood transfusions. Sometimes, the cause is not known. What increases the risk?  Being a woman.  Being allergic to foods such as: ? Citrus fruits. ? Milk. ? Eggs. ? Peanuts. ? Tree nuts. ? Shellfish.  Being allergic to: ? Medicines. ? Latex. ? Insects. ? Animals. ? Pollen. What are the signs or symptoms?   Raised, itchy, red or white bumps or patches on your skin. These areas may: ? Get large and swollen. ? Change in shape and location. ? Stand alone or connect to each other over a large area of skin. ? Sting or hurt. ? Turn white when pressed in the center (blanch). In very bad cases, your hands, feet, and face may also get swollen. This may happen if hives start deeper in your skin. How is this treated? Treatment for this condition depends on your symptoms. Treatment may include:  Using cool, wet cloths (cool compresses) or taking cool showers to stop the itching.  Medicines that help: ? Relieve itching (antihistamines). ? Reduce swelling  (corticosteroids). ? Treat infection (antibiotics).  A medicine (omalizumab) that is given as a shot (injection). Your doctor may prescribe this if you have hives that do not get better even after other treatments.  In very bad cases, you may need a shot of a medicine called epinephrineto prevent a life-threatening allergic reaction (anaphylaxis). Follow these instructions at home: Medicines  Take or apply over-the-counter and prescription medicines only as told by your doctor.  If you were prescribed an antibiotic medicine, use it as told by your doctor. Do not stop using it even if you start to feel better. Skin care  Apply cool, wet cloths to the hives.  Do not scratch your skin. Do not rub your skin. General instructions  Do not take hot showers or baths. This can make itching worse.  Do not wear tight clothes.  Use sunscreen and wear clothes that cover your skin when you are outside.  Avoid any triggers that cause your hives. Keep a journal to help track what causes your hives. Write down: ? What medicines you take. ? What you eat and drink. ? What products you use on your skin.  Keep all follow-up visits as told by your doctor. This is important. Contact a doctor if:  Your symptoms are not better with medicine.  Your joints hurt or are swollen. Get help right away if:  You have a fever.  You have pain in your belly (abdomen).  Your tongue or lips are swollen.  Your eyelids are swollen.  Your chest or throat feels tight.  You have trouble breathing or swallowing. These symptoms may be an emergency. Do not wait to see if the symptoms will go away. Get medical help right away. Call your local emergency services (911 in the [place] Do not drive yourself to the hospital. Summary  Hives are itchy, red, swollen areas on your skin.  Treatment for this condition depends on your symptoms.  Avoid things that cause your hives. Keep a journal to help track what causes  your hives.  Take and apply over-the-counter and prescription medicines only as told by your doctor.  Keep all follow-up visits as told by your doctor. This is important. This information is not intended to replace advice given to you by your health care provider. Make sure you discuss any questions you have with your health care provider. Document Revised: 05/10/2018 Document Reviewed: 05/10/2018 Elsevier Patient Education  [employer].

## 2020-09-29 NOTE — Progress Notes (Signed)
   Subjective:    Patient ID: Natalie Blair, female    DOB: [birth year], 44 y.o.   MRN: [id number]  HPI Chief Complaint  Patient presents with  . Fatigue    x 1-2 months... denies any changes with diet or meds.... Denies cardiac Sx...pt denies changes with sleep getting 6 hours of sleep at night...   This is a 44 yo female who presents today with above cc.  She has noticed for 1-2 months. This is similar to how she was feeling in Feb. Got better, was exercising, now too tired. Goes to sleep around 10, gets up around 6, wakes once, goes back to sleep. Occasional left calf pain, sharp, intermittent. Stopped all exercise about 2 weeks ago. Bowel movements a little softer, no blood or mucus. No dysuria, frequency.  Migraines- occasionally awakens with one, takes Excedrin with good relief.  Occasional spotting since ablation, cycle seems to be regular with other symptoms.   Energy best in the evening, mornings she doesn't want to get up in the mornings (no change on non work days). No change in her diet. Stress level unchanged, has a lot going on, doesn't feel that it is contributing.   Has been having daily hives for about the same amount of time as fatigue. Mostly on trunk, no facial/ lip/ tongue swelling. No known trigger. Had in the past many years ago. Had allergy work up which was negative. They eventually resolved. Does not take anything for them.   Review of Systems Per HPI    Objective:   Physical Exam Physical Exam  Constitutional: Oriented to person, place, and time. Appears well-developed and well-nourished.  HENT:  Head: Normocephalic and atraumatic.  Eyes: Conjunctivae are normal.  Neck: Normal range of motion. Neck supple.  Cardiovascular: Normal rate, regular rhythm and normal heart sounds.   Pulmonary/Chest: Effort normal and breath sounds normal.  Musculoskeletal: No lower extremity edema.   Neurological: Alert and oriented to person, place, and time.  Skin: Skin is warm  and dry. No hives present at this time.  Psychiatric: Normal mood and affect. Behavior is normal. Judgment and thought content normal.  Vitals reviewed.     BP 106/70   Pulse 81   Temp (!) 97.3 F (36.3 C) (Temporal)   Wt 167 lb (75.8 kg)   SpO2 99%   BMI 26.16 kg/m  Wt Readings from Last 3 Encounters:  09/29/20 167 lb (75.8 kg)  12/19/19 167 lb 12.8 oz (76.1 kg)  09/05/19 164 lb 12.8 oz (74.8 kg)       Assessment & Plan:  1. Urticaria - unclear etiology, will check labs, ? Autoimmune issue - ANA - TSH - Comprehensive metabolic panel - CBC with Differential - High sensitivity CRP  2. Fatigue, unspecified type - encouraged her to do mild exercise, walking/ stretching, make healthy food choices.  - ANA - TSH - Comprehensive metabolic panel - CBC with Differential - High sensitivity CRP  This visit occurred during the SARS-CoV-2 public health emergency.  Safety protocols were in place, including screening questions prior to the visit, additional usage of staff PPE, and extensive cleaning of exam room while observing appropriate contact time as indicated for disinfecting solutions.    Clarene Reamer, FNP-BC  [facility] at [facility], [facility]  09/29/2020 10:42 AM

## 2020-10-01 LAB — ANTI-NUCLEAR AB-TITER (ANA TITER): ANA Titer 1: 1:1280 {titer} — ABNORMAL HIGH

## 2020-10-01 LAB — ANA: Anti Nuclear Antibody (ANA): POSITIVE — AB

## 2020-10-05 NOTE — Addendum Note (Signed)
Addended by: Clarene Reamer B on: 10/05/2020 08:11 PM   Modules accepted: Orders

## 2020-10-20 DIAGNOSIS — F419 Anxiety disorder, unspecified: Secondary | ICD-10-CM | POA: Diagnosis not present

## 2020-10-20 DIAGNOSIS — F902 Attention-deficit hyperactivity disorder, combined type: Secondary | ICD-10-CM | POA: Diagnosis not present

## 2020-10-20 DIAGNOSIS — F338 Other recurrent depressive disorders: Secondary | ICD-10-CM | POA: Diagnosis not present

## 2020-10-20 DIAGNOSIS — Z79899 Other long term (current) drug therapy: Secondary | ICD-10-CM | POA: Diagnosis not present

## 2020-10-29 ENCOUNTER — Ambulatory Visit: Payer: BC Managed Care – PPO | Admitting: Family Medicine

## 2020-10-29 DIAGNOSIS — Z0289 Encounter for other administrative examinations: Secondary | ICD-10-CM

## 2020-11-20 DIAGNOSIS — Z20822 Contact with and (suspected) exposure to covid-19: Secondary | ICD-10-CM | POA: Diagnosis not present

## 2020-11-23 DIAGNOSIS — Z20822 Contact with and (suspected) exposure to covid-19: Secondary | ICD-10-CM | POA: Diagnosis not present

## 2020-12-01 DIAGNOSIS — L509 Urticaria, unspecified: Secondary | ICD-10-CM | POA: Diagnosis not present

## 2020-12-01 DIAGNOSIS — R768 Other specified abnormal immunological findings in serum: Secondary | ICD-10-CM | POA: Diagnosis not present

## 2020-12-01 DIAGNOSIS — R5383 Other fatigue: Secondary | ICD-10-CM | POA: Diagnosis not present

## 2020-12-01 DIAGNOSIS — D8989 Other specified disorders involving the immune mechanism, not elsewhere classified: Secondary | ICD-10-CM | POA: Diagnosis not present

## 2020-12-10 DIAGNOSIS — R5382 Chronic fatigue, unspecified: Secondary | ICD-10-CM | POA: Diagnosis not present

## 2020-12-10 DIAGNOSIS — Z79899 Other long term (current) drug therapy: Secondary | ICD-10-CM | POA: Diagnosis not present

## 2020-12-10 DIAGNOSIS — M3500 Sicca syndrome, unspecified: Secondary | ICD-10-CM | POA: Diagnosis not present

## 2020-12-11 ENCOUNTER — Encounter: Payer: Self-pay | Admitting: Family Medicine

## 2020-12-11 DIAGNOSIS — H16223 Keratoconjunctivitis sicca, not specified as Sjogren's, bilateral: Secondary | ICD-10-CM | POA: Diagnosis not present

## 2020-12-11 DIAGNOSIS — Z79899 Other long term (current) drug therapy: Secondary | ICD-10-CM | POA: Diagnosis not present

## 2020-12-11 DIAGNOSIS — H04123 Dry eye syndrome of bilateral lacrimal glands: Secondary | ICD-10-CM | POA: Diagnosis not present

## 2021-03-10 DIAGNOSIS — M533 Sacrococcygeal disorders, not elsewhere classified: Secondary | ICD-10-CM | POA: Diagnosis not present

## 2021-03-10 DIAGNOSIS — R5382 Chronic fatigue, unspecified: Secondary | ICD-10-CM | POA: Diagnosis not present

## 2021-03-10 DIAGNOSIS — G8929 Other chronic pain: Secondary | ICD-10-CM | POA: Diagnosis not present

## 2021-03-10 DIAGNOSIS — M3500 Sicca syndrome, unspecified: Secondary | ICD-10-CM | POA: Diagnosis not present

## 2021-03-10 DIAGNOSIS — Z79899 Other long term (current) drug therapy: Secondary | ICD-10-CM | POA: Diagnosis not present

## 2021-03-31 DIAGNOSIS — F902 Attention-deficit hyperactivity disorder, combined type: Secondary | ICD-10-CM | POA: Diagnosis not present

## 2021-03-31 DIAGNOSIS — Z79899 Other long term (current) drug therapy: Secondary | ICD-10-CM | POA: Diagnosis not present

## 2021-04-02 DIAGNOSIS — H5713 Ocular pain, bilateral: Secondary | ICD-10-CM | POA: Diagnosis not present

## 2021-04-15 DIAGNOSIS — M791 Myalgia, unspecified site: Secondary | ICD-10-CM | POA: Diagnosis not present

## 2021-05-19 IMAGING — MG DIGITAL SCREENING BILAT W/ TOMO W/ CAD
8 series · 8 of 24 positions shown · non-contrast
Comparison: Previous exam(s).

CLINICAL DATA: Screening.

EXAM:
DIGITAL SCREENING BILATERAL MAMMOGRAM WITH TOMO AND CAD

[R CC synth-2D]
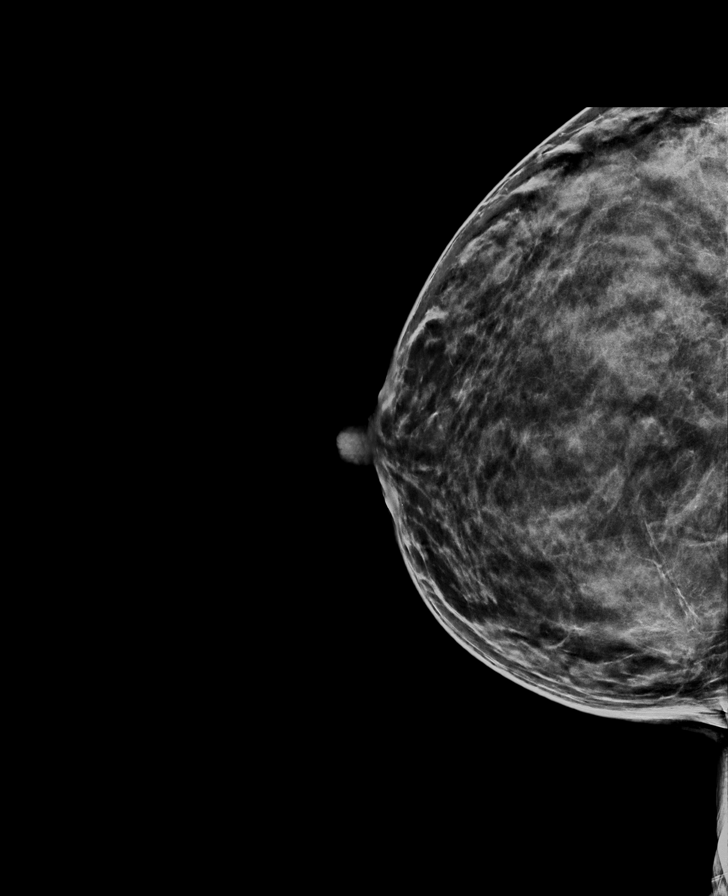

[R MLO synth-2D]
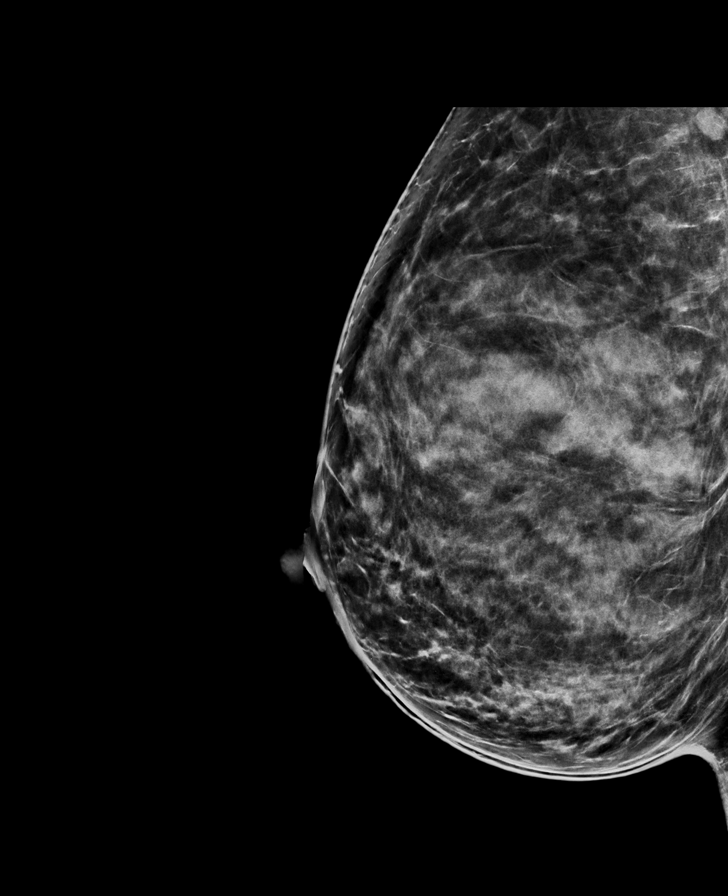

[L CC synth-2D]
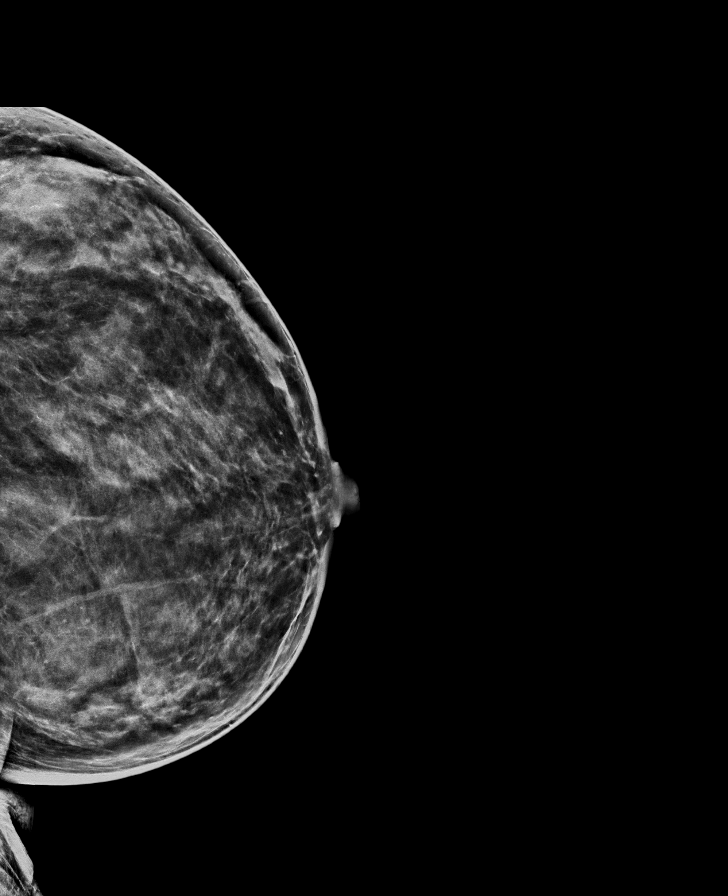

[L MLO synth-2D]
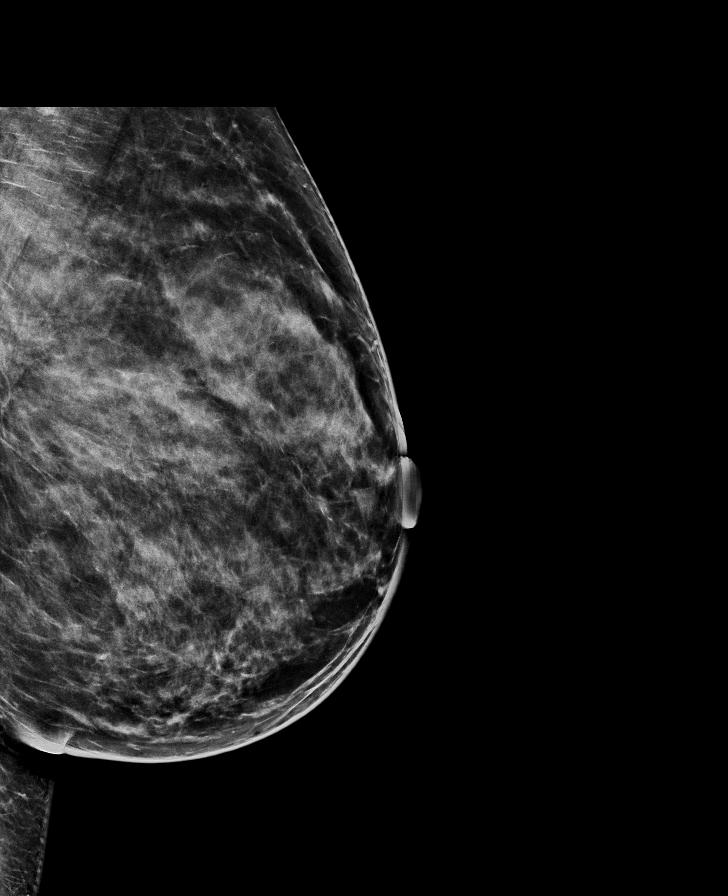

[R MLO tomo · tomo slice 39/77.0]
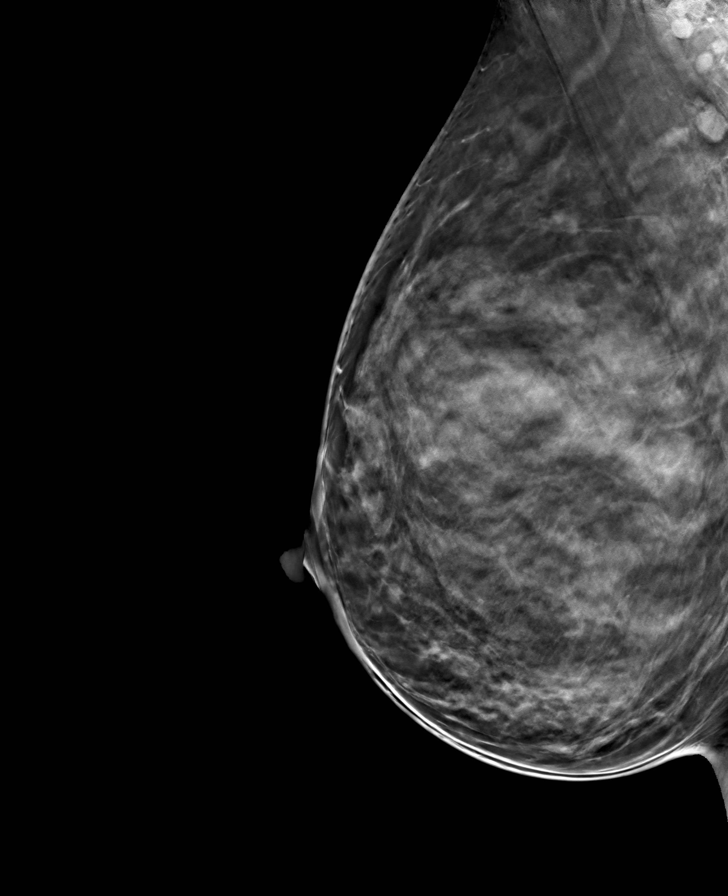

[R CC tomo · tomo slice 37/73.0]
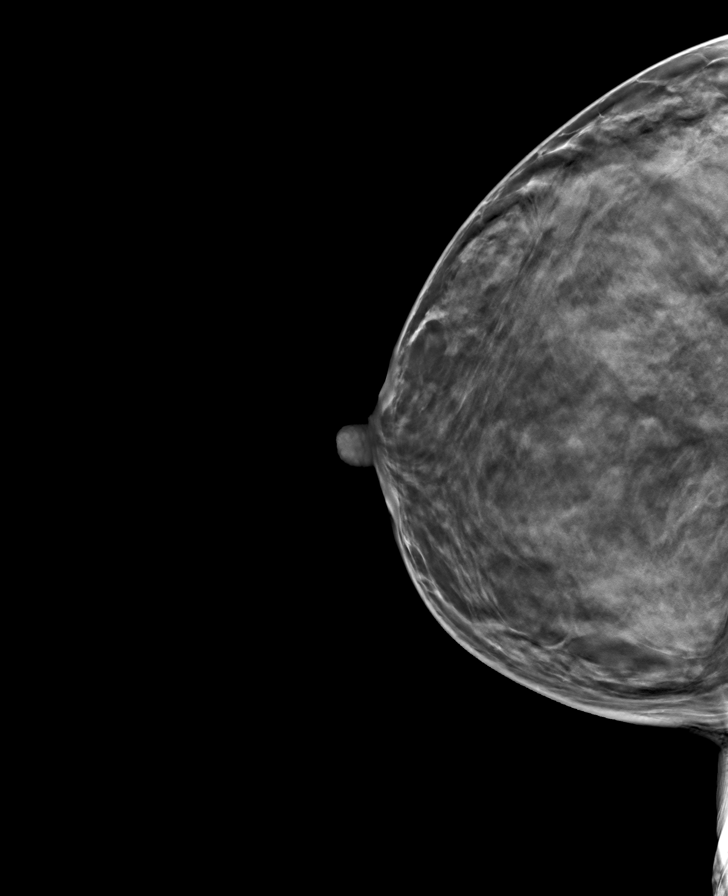

[L CC tomo · tomo slice 37/72.0]
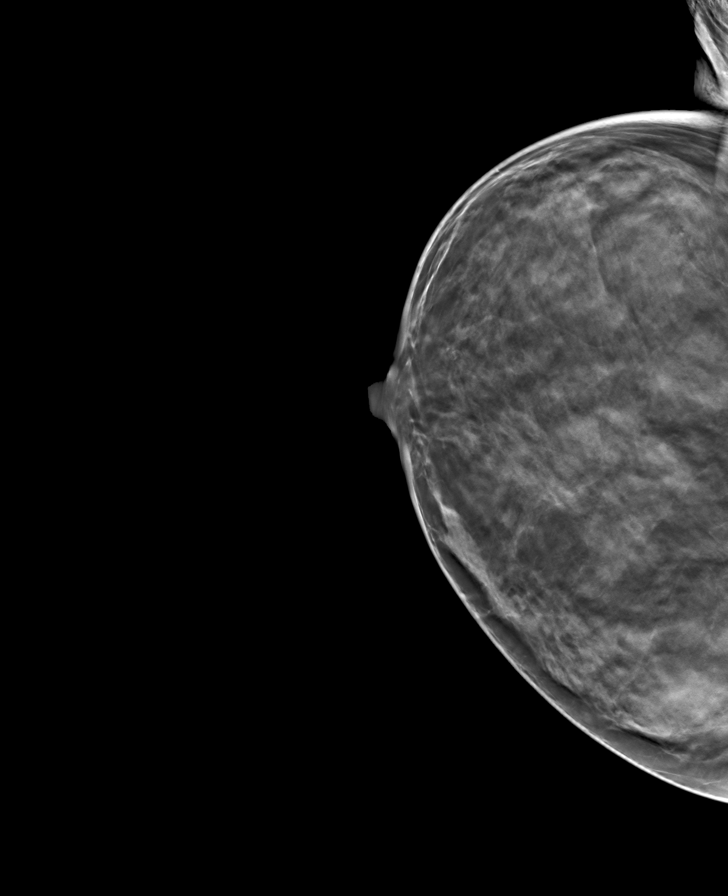

[L MLO tomo · tomo slice 39/76.0]
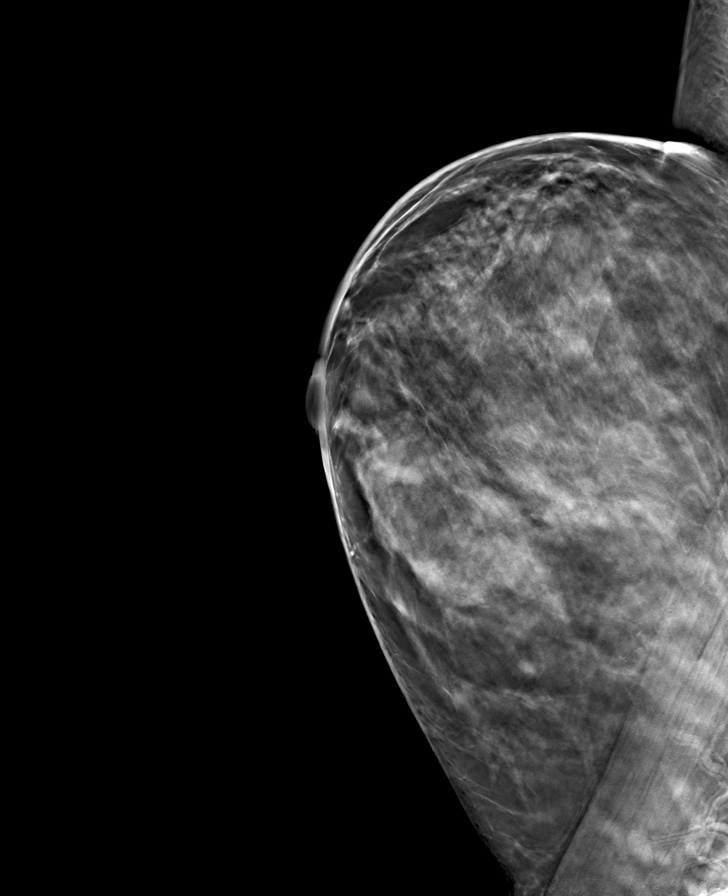

[8 of 24 positions shown; findings below may reference images not displayed]

ACR Breast Density Category d: The breast tissue is extremely dense,
which lowers the sensitivity of mammography.
FINDINGS: In the right breast possible distortion requires further evaluation.

In the left breast possible distortion requires further evaluation.

Images were processed with CAD.
IMPRESSION: Further evaluation is suggested for possible distortion in the right
breast.

Further evaluation is suggested for possible distortion in the left
breast.

RECOMMENDATION:
Diagnostic mammogram and possibly ultrasound of both breasts.
(Code:[id number]-99C)

The patient will be contacted regarding the findings, and additional
imaging will be scheduled.

BI-RADS CATEGORY  0: Incomplete. Need additional imaging evaluation
and/or prior mammograms for comparison.

## 2021-07-03 DIAGNOSIS — Z79899 Other long term (current) drug therapy: Secondary | ICD-10-CM | POA: Diagnosis not present

## 2021-07-03 DIAGNOSIS — F902 Attention-deficit hyperactivity disorder, combined type: Secondary | ICD-10-CM | POA: Diagnosis not present

## 2021-07-14 DIAGNOSIS — M3500 Sicca syndrome, unspecified: Secondary | ICD-10-CM | POA: Diagnosis not present

## 2021-07-14 DIAGNOSIS — Z79899 Other long term (current) drug therapy: Secondary | ICD-10-CM | POA: Diagnosis not present

## 2021-07-14 DIAGNOSIS — R5382 Chronic fatigue, unspecified: Secondary | ICD-10-CM | POA: Diagnosis not present

## 2021-09-10 ENCOUNTER — Ambulatory Visit: Payer: BC Managed Care – PPO | Admitting: Nurse Practitioner

## 2021-09-10 ENCOUNTER — Encounter: Payer: Self-pay | Admitting: Nurse Practitioner

## 2021-09-10 ENCOUNTER — Other Ambulatory Visit: Payer: Self-pay

## 2021-09-10 VITALS — BP 96/74 | HR 98 | Temp 97.9°F | Resp 10 | Ht 67.0 in | Wt 155.1 lb

## 2021-09-10 DIAGNOSIS — Z0000 Encounter for general adult medical examination without abnormal findings: Secondary | ICD-10-CM | POA: Diagnosis not present

## 2021-09-10 DIAGNOSIS — T63441A Toxic effect of venom of bees, accidental (unintentional), initial encounter: Secondary | ICD-10-CM | POA: Diagnosis not present

## 2021-09-10 DIAGNOSIS — Z0001 Encounter for general adult medical examination with abnormal findings: Secondary | ICD-10-CM

## 2021-09-10 DIAGNOSIS — R079 Chest pain, unspecified: Secondary | ICD-10-CM

## 2021-09-10 DIAGNOSIS — R0789 Other chest pain: Secondary | ICD-10-CM | POA: Insufficient documentation

## 2021-09-10 DIAGNOSIS — Z1211 Encounter for screening for malignant neoplasm of colon: Secondary | ICD-10-CM

## 2021-09-10 DIAGNOSIS — M3500 Sicca syndrome, unspecified: Secondary | ICD-10-CM

## 2021-09-10 DIAGNOSIS — Z91038 Other insect allergy status: Secondary | ICD-10-CM

## 2021-09-10 LAB — COMPREHENSIVE METABOLIC PANEL
ALT: 6 U/L (ref 0–35)
AST: 11 U/L (ref 0–37)
Albumin: 4.2 g/dL (ref 3.5–5.2)
Alkaline Phosphatase: 32 U/L — ABNORMAL LOW (ref 39–117)
BUN: 9 mg/dL (ref 6–23)
CO2: 30 mEq/L (ref 19–32)
Calcium: 9.3 mg/dL (ref 8.4–10.5)
Chloride: 102 mEq/L (ref 96–112)
Creatinine, Ser: 0.87 mg/dL (ref 0.40–1.20)
GFR: 79.14 mL/min (ref >60.00)
Glucose, Bld: 70 mg/dL (ref 70–99)
Potassium: 5.1 mEq/L (ref 3.5–5.1)
Sodium: 136 mEq/L (ref 135–145)
Total Bilirubin: 0.4 mg/dL (ref 0.2–1.2)
Total Protein: 6.9 g/dL (ref 6.0–8.3)

## 2021-09-10 LAB — CBC
HCT: 41.3 % (ref 36.0–46.0)
Hemoglobin: 14 g/dL (ref 12.0–15.0)
MCHC: 34 g/dL (ref 30.0–36.0)
MCV: 92 fl (ref 78.0–100.0)
Platelets: 230 10*3/uL (ref 150.0–400.0)
RBC: 4.49 Mil/uL (ref 3.87–5.11)
RDW: 13.1 % (ref 11.5–15.5)
WBC: 3.3 10*3/uL — ABNORMAL LOW (ref 4.0–10.5)

## 2021-09-10 LAB — LIPID PANEL
Cholesterol: 181 mg/dL (ref 0–200)
HDL: 71.2 mg/dL (ref >39.00)
LDL Cholesterol: 103 mg/dL — ABNORMAL HIGH (ref 0–99)
NonHDL: 110.09
Total CHOL/HDL Ratio: 3
Triglycerides: 36 mg/dL (ref 0.0–149.0)
VLDL: 7.2 mg/dL (ref 0.0–40.0)

## 2021-09-10 LAB — HEMOGLOBIN A1C: Hgb A1c MFr Bld: 5 % (ref 4.6–6.5)

## 2021-09-10 LAB — TSH: TSH: 1.39 u[IU]/mL (ref 0.35–5.50)

## 2021-09-10 MED ORDER — METHYLPREDNISOLONE ACETATE 40 MG/ML IJ SUSP
40.0000 mg | Freq: Once | INTRAMUSCULAR | Status: AC
  Administered 2021-09-10: 40 mg via INTRAMUSCULAR

## 2021-09-10 NOTE — Assessment & Plan Note (Signed)
Atypical presentation that is very short-lived not daily maybe every other day.  Patient thinks it may be in relation to her stimulant for her ADHD.  EKG was within normal limits in office.  Patient has no large risk factors for self but does have paternal side family history of vascular disease told patient we will continue to monitor if it persists and labs look okay we can send her to cardiology for official clearance work-up.  Patient knowledge and agree

## 2021-09-10 NOTE — Assessment & Plan Note (Addendum)
Local reaction to bee sting to right knee.  Patient was curious about EpiPen so patient not at this point in time did say that she can always start out with some Benadryl and did discuss signs and symptoms of anaphylaxis and when to be seen in the hospital if this does develop.  Continue to monitor. Depo-Medrol 40 mg IM given in clinic.

## 2021-09-10 NOTE — Patient Instructions (Signed)
Nice to see you Will see you in a year, sooner if needed Keep following with your specialists If the chest discomfort keeps happening let me know and we can get you to cardiology

## 2021-09-10 NOTE — Assessment & Plan Note (Signed)
Discussed appropriate immunizations and preventative screening exams with patient ordered appropriate ones.  Encouraged healthy lifestyle.  Patient currently exercises continue doing such.

## 2021-09-10 NOTE — Assessment & Plan Note (Signed)
Followed by rheumatology every 6 months.  Continue taking medications as prescribed from them follow-up as recommended.

## 2021-09-10 NOTE — Progress Notes (Addendum)
Established Patient Office Visit  Subjective:  Patient ID: Natalie Blair, female    DOB: 1976-07-31  Age: 45 y.o. MRN: [id number]  CC:  Chief Complaint  Patient presents with   Transfer of Care   Joint Swelling    Got stung by a bee on right knee on 09/05/21, swelling, hot to the touch, pain present. Some hives present on the knee off and on.    HPI [name] presents for [facility]  Bee sting: Stung on Saturday (09/05/2021). Was walking and felt like there was something under her pants and it was a bee. Itching, redness, warmth, and swelling still present  Sjorens: Rheum every 6 months. Dr Ephriam Jenkins. ADHD: [place] specailst every 3 months currently on Methylphenidate   Eye exam: this year sees every 6 month. Dr Nicolasa Ducking.  Headaches once weekly managed with OTC medications. States that she does not want to pursue preventative medication at the current time.  for complete physical and follow up of chronic conditions.  Immunizations: -Tetanus: Defer -Influenza: Defer -Covid-19: Defer -Shingles: NA -Pneumonia: NA  -HPV: NA  Diet: Fair diet. No red meat, no pork. Fish and veggies Exercise:  2-3 times a week at 30-59min a session cardio and weights  Eye exam: Completes every 6 months Dental exam: Completes semi-annually   Pap Smear: Completed in Dr. Mancel Bale In [place] Mammogram: Completed in 03/21 08/21 Dexa: NA Colonoscopy: Ordered today   Lung Cancer Screening: NA      Past Medical History:  Diagnosis Date   History of depression    History of headache    History of migraine     Past Surgical History:  Procedure Laterality Date   BREAST REDUCTION SURGERY  2004   REDUCTION MAMMAPLASTY      Family History  Problem Relation Age of Onset   Depression Mother    Heart disease Father    Hypertension Father    Stroke Father    Breast cancer Maternal Grandmother     Social History   Socioeconomic History   Marital status: Single    Spouse name:  Not on file   Number of children: Not on file   Years of education: Not on file   Highest education level: Not on file  Occupational History   Not on file  Tobacco Use   Smoking status: Never   Smokeless tobacco: Never  Substance and Sexual Activity   Alcohol use: Yes    Comment: socially/occ   Drug use: Not Currently   Sexual activity: Not on file  Other Topics Concern   Not on file  Social History Narrative   Not on file   Social Determinants of Health   Financial Resource Strain: Not on file  Food Insecurity: Not on file  Transportation Needs: Not on file  Physical Activity: Not on file  Stress: Not on file  Social Connections: Not on file  Intimate Partner Violence: Not on file    Outpatient Medications Prior to Visit  Medication Sig Dispense Refill   Ascorbic Acid (VITAMIN C PO) Take 1 capsule by mouth daily.     Cholecalciferol (VITAMIN D3 PO) Take 1 capsule by mouth daily.     hydroxychloroquine (PLAQUENIL) 200 MG tablet Take 1 tablet by mouth 2 (two) times daily.     ibuprofen (ADVIL) 600 MG tablet Take 600 mg by mouth every 6 (six) hours as needed.     methylphenidate 36 MG PO CR tablet Take 36 mg by mouth every morning.  NALTREXONE HCL PO Take by mouth. 2.5 mg once daily     Probiotic Product (PROBIOTIC PO) Take 1 capsule by mouth daily.      RESTASIS 0.05 % ophthalmic emulsion 1 drop 2 (two) times daily.     VYVANSE 20 MG capsule Take 1 capsule by mouth daily.     No facility-administered medications prior to visit.    No Known Allergies  ROS Review of Systems  Constitutional:  Positive for fatigue. Negative for chills and fever.  Respiratory:  Negative for cough and shortness of breath.   Cardiovascular:  Positive for chest pain (aching self limited chest pain approx every other day) and leg swelling.  Gastrointestinal:  Negative for abdominal pain, blood in stool, diarrhea, nausea and vomiting.  Genitourinary:  Positive for vaginal bleeding (break  through bleeding). Negative for dysuria, hematuria, vaginal discharge and vaginal pain.  Musculoskeletal:  Positive for arthralgias.  Skin:  Positive for color change.       Itching +  Neurological:  Positive for headaches.  Psychiatric/Behavioral:  Negative for hallucinations and suicidal ideas.      Objective:    Physical Exam Vitals and nursing note reviewed.  Constitutional:      Appearance: Normal appearance.  HENT:     Head: Normocephalic.     Right Ear: Tympanic membrane, ear canal and external ear normal. There is no impacted cerumen.     Left Ear: Tympanic membrane, ear canal and external ear normal. There is no impacted cerumen.     Mouth/Throat:     Mouth: Mucous membranes are moist.  Eyes:     Extraocular Movements: Extraocular movements intact.     Pupils: Pupils are equal, round, and reactive to light.  Neck:     Thyroid: No thyroid mass, thyromegaly or thyroid tenderness.  Cardiovascular:     Rate and Rhythm: Normal rate and regular rhythm.     Pulses: Normal pulses.  Pulmonary:     Effort: Pulmonary effort is normal.     Breath sounds: Normal breath sounds.  Abdominal:     General: Bowel sounds are normal. There is no distension.     Palpations: There is no mass.     Tenderness: There is no abdominal tenderness.  Lymphadenopathy:     Cervical: No cervical adenopathy.  Skin:    General: Skin is warm.     Findings: Erythema present.     Comments: Discoloration and swelling noted to right knee.  Neurological:     Mental Status: She is alert.  Psychiatric:        Mood and Affect: Mood normal.        Behavior: Behavior normal.        Thought Content: Thought content normal.        Judgment: Judgment normal.    BP 96/74   Pulse 98   Temp 97.9 F (36.6 C)   Resp 10   Ht 5\' 7"  (1.702 m)   Wt 155 lb 2 oz (70.4 kg)   SpO2 99%   BMI 24.30 kg/m  Wt Readings from Last 3 Encounters:  09/10/21 155 lb 2 oz (70.4 kg)  09/29/20 167 lb (75.8 kg)  12/19/19  167 lb 12.8 oz (76.1 kg)     Health Maintenance Due  Topic Date Due   Pneumococcal Vaccine 18-89 Years old (1 - PCV) Never done   HIV Screening  Never done   Hepatitis C Screening  Never done   TETANUS/TDAP  Never done  COLONOSCOPY (Pts 45-78yrs Insurance coverage will need to be confirmed)  Never done    There are no preventive care reminders to display for this patient.  Lab Results  Component Value Date   TSH 0.70 09/29/2020   Lab Results  Component Value Date   WBC 4.3 09/29/2020   HGB 13.8 09/29/2020   HCT 40.0 09/29/2020   MCV 93.0 09/29/2020   PLT 213.0 09/29/2020   Lab Results  Component Value Date   NA 136 09/29/2020   K 4.3 09/29/2020   CO2 27 09/29/2020   GLUCOSE 79 09/29/2020   BUN 14 09/29/2020   CREATININE 0.85 09/29/2020   BILITOT 0.3 09/29/2020   ALKPHOS 29 (L) 09/29/2020   AST 13 09/29/2020   ALT 7 09/29/2020   PROT 6.6 09/29/2020   ALBUMIN 3.9 09/29/2020   CALCIUM 8.3 (L) 09/29/2020   GFR 81.92 09/29/2020   Lab Results  Component Value Date   CHOL 188 12/19/2019   Lab Results  Component Value Date   HDL 64.90 12/19/2019   Lab Results  Component Value Date   LDLCALC 115 (H) 12/19/2019   Lab Results  Component Value Date   TRIG 42.0 12/19/2019   Lab Results  Component Value Date   CHOLHDL 3 12/19/2019   No results found for: HGBA1C    Assessment & Plan:   Problem List Items Addressed This Visit       Other   Chest pain    Atypical presentation that is very short-lived not daily maybe every other day.  Patient thinks it may be in relation to her stimulant for her ADHD.  EKG was within normal limits in office.  Patient has no large risk factors for self but does have paternal side family history of vascular disease told patient we will continue to monitor if it persists and labs look okay we can send her to cardiology for official clearance work-up.  Patient knowledge and agree      Relevant Orders   EKG 12-Lead (Completed)    Preventative health care - Primary    Discussed appropriate immunizations and preventative screening exams with patient ordered appropriate ones.  Encouraged healthy lifestyle.  Patient currently exercises continue doing such.      Relevant Orders   CBC (Completed)   Comprehensive metabolic panel (Completed)   Hemoglobin A1c (Completed)   TSH (Completed)   Lipid panel (Completed)   Sjogren's syndrome ([place])    Followed by rheumatology every 6 months.  Continue taking medications as prescribed from them follow-up as recommended.      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  Patient was curious about EpiPen so patient not at this point in time did say that she can always start out with some Benadryl and did discuss signs and symptoms of anaphylaxis and when to be seen in the hospital if this does develop.  Continue to monitor. Depo-Medrol 40 mg IM given in clinic.      Other Visit Diagnoses     Screening for colon cancer       Relevant Orders   Ambulatory referral to Gastroenterology       No orders of the defined types were placed in this encounter.   Follow-up: Return in about 1 year (around 09/10/2022) for CPE and labs.   This visit occurred during the SARS-CoV-2 public health emergency.  Safety protocols were in place, including screening questions prior to the visit, additional usage  of staff PPE, and extensive cleaning of exam room while observing appropriate contact time as indicated for disinfecting solutions.   Romilda Garret, NP

## 2021-10-06 DIAGNOSIS — F902 Attention-deficit hyperactivity disorder, combined type: Secondary | ICD-10-CM | POA: Diagnosis not present

## 2021-10-06 DIAGNOSIS — Z79899 Other long term (current) drug therapy: Secondary | ICD-10-CM | POA: Diagnosis not present

## 2021-10-06 DIAGNOSIS — F419 Anxiety disorder, unspecified: Secondary | ICD-10-CM | POA: Diagnosis not present

## 2021-10-19 ENCOUNTER — Encounter: Payer: Self-pay | Admitting: Nurse Practitioner

## 2021-10-19 DIAGNOSIS — D72819 Decreased white blood cell count, unspecified: Secondary | ICD-10-CM

## 2021-10-28 DIAGNOSIS — D72819 Decreased white blood cell count, unspecified: Secondary | ICD-10-CM | POA: Insufficient documentation

## 2021-10-28 NOTE — Telephone Encounter (Signed)
Can we call and get her on the lab schedule for a redraw of one of her labs. The patient is aware of the plan already. Anytime is fine

## 2021-10-28 NOTE — Addendum Note (Signed)
Addended by: Ellamae Sia on: 10/28/2021 09:51 AM   Modules accepted: Orders

## 2021-10-29 NOTE — Telephone Encounter (Signed)
Called patient and sent mychart message about this

## 2021-11-12 ENCOUNTER — Encounter: Payer: Self-pay | Admitting: Nurse Practitioner

## 2021-11-26 ENCOUNTER — Telehealth: Payer: Self-pay

## 2021-11-26 NOTE — Telephone Encounter (Signed)
Pt said for couple of wks on and off pt has had mid to right sided CP that can be either sharpe or dull and feels like it goes into back. Last dull CP that last few seconds was on 11/25/21; last sharpe CP that only lasted few seconds happened on 11/23/21. No radiation of pain into neck,jaw,shoulder or arms. Pt said about one month ago pts ADHD doctor started pt on Guanfacine 1 mg; Guanfacine was increase to 2 or 2.5 mg about 2 wks ago when pt began with CP. Pt said does not have dizziness, SOB, and no unusual H/A. Pt said usually has H/A once a wk for long time. No covid symptoms or known exposure per pt. Pt said she does not feel like she is in any distress and does not want to go to UC or ED. Pt did move appt up to 11/27/21 at [facility] with Romilda Garret NP with UC & ED precautions and pt voiced understanding. Sending note to Jannette Spanner NP and Anastasiya CMA and will teams Anastasiya.

## 2021-11-26 NOTE — Telephone Encounter (Signed)
Please triage patient. Patient made an appointment through mychart for 11/30/21 for chest pains. This has been discussed in previous appointment but need to make sure this is not a new/different issue.  Thank you

## 2021-11-26 NOTE — Telephone Encounter (Signed)
Noted! Thank you

## 2021-11-27 ENCOUNTER — Encounter: Payer: Self-pay | Admitting: Nurse Practitioner

## 2021-11-27 ENCOUNTER — Ambulatory Visit (INDEPENDENT_AMBULATORY_CARE_PROVIDER_SITE_OTHER)
Admission: RE | Admit: 2021-11-27 | Discharge: 2021-11-27 | Disposition: A | Discharge status: Home / Self Care | Payer: BC Managed Care – PPO | Source: Ambulatory Visit | Attending: Nurse Practitioner | Admitting: Nurse Practitioner

## 2021-11-27 ENCOUNTER — Other Ambulatory Visit: Payer: Self-pay

## 2021-11-27 ENCOUNTER — Ambulatory Visit: Payer: BC Managed Care – PPO | Admitting: Nurse Practitioner

## 2021-11-27 VITALS — BP 102/60 | HR 74 | Temp 97.7°F | Resp 10 | Ht 67.0 in | Wt 162.0 lb

## 2021-11-27 DIAGNOSIS — R0789 Other chest pain: Secondary | ICD-10-CM

## 2021-11-27 DIAGNOSIS — D72819 Decreased white blood cell count, unspecified: Secondary | ICD-10-CM | POA: Diagnosis not present

## 2021-11-27 LAB — CBC WITH DIFFERENTIAL/PLATELET
Basophils Absolute: 0 10*3/uL (ref 0.0–0.1)
Basophils Relative: 0.4 % (ref 0.0–3.0)
Eosinophils Absolute: 0 10*3/uL (ref 0.0–0.7)
Eosinophils Relative: 0.7 % (ref 0.0–5.0)
HCT: 37.1 % (ref 36.0–46.0)
Hemoglobin: 12.6 g/dL (ref 12.0–15.0)
Lymphocytes Relative: 45.7 % (ref 12.0–46.0)
Lymphs Abs: 1.7 10*3/uL (ref 0.7–4.0)
MCHC: 33.9 g/dL (ref 30.0–36.0)
MCV: 92.7 fl (ref 78.0–100.0)
Monocytes Absolute: 0.3 10*3/uL (ref 0.1–1.0)
Monocytes Relative: 7.2 % (ref 3.0–12.0)
Neutro Abs: 1.7 10*3/uL (ref 1.4–7.7)
Neutrophils Relative %: 46 % (ref 43.0–77.0)
Platelets: 194 10*3/uL (ref 150.0–400.0)
RBC: 4 Mil/uL (ref 3.87–5.11)
RDW: 13.1 % (ref 11.5–15.5)
WBC: 3.7 10*3/uL — ABNORMAL LOW (ref 4.0–10.5)

## 2021-11-27 LAB — COMPREHENSIVE METABOLIC PANEL
ALT: 7 U/L (ref 0–35)
AST: 12 U/L (ref 0–37)
Albumin: 3.7 g/dL (ref 3.5–5.2)
Alkaline Phosphatase: 27 U/L — ABNORMAL LOW (ref 39–117)
BUN: 7 mg/dL (ref 6–23)
CO2: 28 mEq/L (ref 19–32)
Calcium: 8.2 mg/dL — ABNORMAL LOW (ref 8.4–10.5)
Chloride: 105 mEq/L (ref 96–112)
Creatinine, Ser: 0.79 mg/dL (ref 0.40–1.20)
GFR: 88.72 mL/min (ref >60.00)
Glucose, Bld: 85 mg/dL (ref 70–99)
Potassium: 4 mEq/L (ref 3.5–5.1)
Sodium: 135 mEq/L (ref 135–145)
Total Bilirubin: 0.4 mg/dL (ref 0.2–1.2)
Total Protein: 6.3 g/dL (ref 6.0–8.3)

## 2021-11-27 LAB — MAGNESIUM: Magnesium: 1.8 mg/dL (ref 1.5–2.5)

## 2021-11-27 NOTE — Assessment & Plan Note (Signed)
Intermittent atypical right-sided chest pain.  Does correlate with increase in her ADHD medication.  Do not think is cardiac in nature does have a history of Sjogren's syndrome.  Pending lab work did offer to do EKG in office today patient politely declined agree with this assessment.  Continue to monitor.  It does sound neuropathic in nature but cannot elicit in office

## 2021-11-27 NOTE — Assessment & Plan Note (Signed)
Incidental finding on last CBC.  Patient concerned and would like it rechecked pending lab results

## 2021-11-27 NOTE — Progress Notes (Signed)
Acute Office Visit  Subjective:    Patient ID: Natalie Blair, female    DOB: May 26, 1976, 46 y.o.   MRN: [id number]  Chief Complaint  Patient presents with   Chest Pain    On the right side of the chest, x 3 weeks off and on. Has happened maybe 3 to 4 times in the last 3 weeks. This is different from the chest pain she had addressed with [facility] during last visit. This is more of a sharp pain that lasts a few seconds.     Patient is in today for Chest discomfort  Started approx 3 weeks ago for the sharp pain. States that it has happened 4 times over the past 3 weeks. States that she recently had a dose increase in her intuniv around the same time this started happening States that it is right sided sharp and wraps around her side to the back No inciting event no other symptoms per patient reports  States that she has not tried otc medications for the pain as it last seconds to minutes    Past Medical History:  Diagnosis Date   History of depression    History of headache    History of migraine     Past Surgical History:  Procedure Laterality Date   BREAST REDUCTION SURGERY  2004   REDUCTION MAMMAPLASTY      Family History  Problem Relation Age of Onset   Depression Mother    Heart disease Father    Hypertension Father    Stroke Father    Breast cancer Maternal Grandmother     Social History   Socioeconomic History   Marital status: Single    Spouse name: Not on file   Number of children: Not on file   Years of education: Not on file   Highest education level: Not on file  Occupational History   Not on file  Tobacco Use   Smoking status: Never   Smokeless tobacco: Never  Substance and Sexual Activity   Alcohol use: Yes    Comment: socially/occ   Drug use: Not Currently   Sexual activity: Not on file  Other Topics Concern   Not on file  Social History Narrative   Not on file   Social Determinants of Health   Financial Resource Strain: Not on file  Food  Insecurity: Not on file  Transportation Needs: Not on file  Physical Activity: Not on file  Stress: Not on file  Social Connections: Not on file  Intimate Partner Violence: Not on file    Outpatient Medications Prior to Visit  Medication Sig Dispense Refill   Ascorbic Acid (VITAMIN C PO) Take 1 capsule by mouth daily.     Cholecalciferol (VITAMIN D3 PO) Take 1 capsule by mouth daily.     guanFACINE (INTUNIV) 2 MG TB24 ER tablet Take 2 mg by mouth daily.     hydroxychloroquine (PLAQUENIL) 200 MG tablet Take 1 tablet by mouth 2 (two) times daily.     ibuprofen (ADVIL) 600 MG tablet Take 600 mg by mouth every 6 (six) hours as needed.     NALTREXONE HCL PO Take by mouth. 2.5 mg once daily     Probiotic Product (PROBIOTIC PO) Take 1 capsule by mouth daily.      RESTASIS 0.05 % ophthalmic emulsion 1 drop 2 (two) times daily.     methylphenidate 36 MG PO CR tablet Take 36 mg by mouth every morning.     No facility-administered medications  prior to visit.    No Known Allergies  Review of Systems  Constitutional:  Negative for chills, fatigue and fever.  Respiratory:  Negative for cough, chest tightness and shortness of breath.   Cardiovascular:  Positive for chest pain and palpitations (prior to sharp pains. not related). Negative for leg swelling.  Gastrointestinal:  Negative for diarrhea, nausea and vomiting.  Neurological:  Negative for dizziness, numbness and headaches.      Objective:    Physical Exam Vitals and nursing note reviewed.  Constitutional:      Appearance: She is well-developed.  Cardiovascular:     Rate and Rhythm: Normal rate and regular rhythm.     Heart sounds: Normal heart sounds.  Pulmonary:     Effort: Pulmonary effort is normal.     Breath sounds: Normal breath sounds.  Chest:     Chest wall: No tenderness.  Abdominal:     General: Bowel sounds are normal. There is no distension.     Palpations: There is no mass.     Tenderness: There is no abdominal  tenderness.  Musculoskeletal:        General: No tenderness or signs of injury.  Skin:    General: Skin is warm.  Neurological:     General: No focal deficit present.     Mental Status: She is alert.     Deep Tendon Reflexes:     Reflex Scores:      Bicep reflexes are 2+ on the right side and 2+ on the left side.   BP 102/60    Pulse 74    Temp 97.7 F (36.5 C)    Resp 10    Ht 5\' 7"  (1.702 m)    Wt 162 lb (73.5 kg)    SpO2 98%    BMI 25.37 kg/m  Wt Readings from Last 3 Encounters:  11/27/21 162 lb (73.5 kg)  09/10/21 155 lb 2 oz (70.4 kg)  09/29/20 167 lb (75.8 kg)    Health Maintenance Due  Topic Date Due   HIV Screening  Never done   Hepatitis C Screening  Never done   TETANUS/TDAP  Never done   COLONOSCOPY (Pts 45-69yrs Insurance coverage will need to be confirmed)  Never done    There are no preventive care reminders to display for this patient.   Lab Results  Component Value Date   TSH 1.39 09/10/2021   Lab Results  Component Value Date   WBC 3.3 (L) 09/10/2021   HGB 14.0 09/10/2021   HCT 41.3 09/10/2021   MCV 92.0 09/10/2021   PLT 230.0 09/10/2021   Lab Results  Component Value Date   NA 136 09/10/2021   K 5.1 09/10/2021   CO2 30 09/10/2021   GLUCOSE 70 09/10/2021   BUN 9 09/10/2021   CREATININE 0.87 09/10/2021   BILITOT 0.4 09/10/2021   ALKPHOS 32 (L) 09/10/2021   AST 11 09/10/2021   ALT 6 09/10/2021   PROT 6.9 09/10/2021   ALBUMIN 4.2 09/10/2021   CALCIUM 9.3 09/10/2021   GFR 79.14 09/10/2021   Lab Results  Component Value Date   CHOL 181 09/10/2021   Lab Results  Component Value Date   HDL 71.20 09/10/2021   Lab Results  Component Value Date   LDLCALC 103 (H) 09/10/2021   Lab Results  Component Value Date   TRIG 36.0 09/10/2021   Lab Results  Component Value Date   CHOLHDL 3 09/10/2021   Lab Results  Component Value Date  HGBA1C 5.0 09/10/2021       Assessment & Plan:   Problem List Items Addressed This Visit        Other   Intermittent right-sided chest pain - Primary    Intermittent atypical right-sided chest pain.  Does correlate with increase in her ADHD medication.  Do not think is cardiac in nature does have a history of Sjogren's syndrome.  Pending lab work did offer to do EKG in office today patient politely declined agree with this assessment.  Continue to monitor.  It does sound neuropathic in nature but cannot elicit in office      Relevant Orders   CBC with Differential/Platelet (Completed)   Comprehensive metabolic panel (Completed)   D-dimer, quantitative   DG Chest 2 View (Completed)   Magnesium (Completed)   Leukopenia    Incidental finding on last CBC.  Patient concerned and would like it rechecked pending lab results       No orders of the defined types were placed in this encounter.  This visit occurred during the SARS-CoV-2 public health emergency.  Safety protocols were in place, including screening questions prior to the visit, additional usage of staff PPE, and extensive cleaning of exam room while observing appropriate contact time as indicated for disinfecting solutions.   Romilda Garret, NP

## 2021-11-27 NOTE — Patient Instructions (Signed)
Nice to see you today I will be in touch with the lab results If symptoms change let me know or follow up Try ibuprofen to see if that helps

## 2021-11-28 LAB — D-DIMER, QUANTITATIVE: D-Dimer, Quant: <0.19 mcg/mL FEU (ref <0.50)

## 2021-11-30 ENCOUNTER — Ambulatory Visit: Payer: BC Managed Care – PPO | Admitting: Nurse Practitioner

## 2022-03-04 ENCOUNTER — Encounter: Payer: Self-pay | Admitting: Internal Medicine

## 2022-04-07 ENCOUNTER — Ambulatory Visit (AMBULATORY_SURGERY_CENTER): Payer: BC Managed Care – PPO | Admitting: *Deleted

## 2022-04-07 VITALS — Ht 67.0 in | Wt 150.0 lb

## 2022-04-07 DIAGNOSIS — Z1211 Encounter for screening for malignant neoplasm of colon: Secondary | ICD-10-CM

## 2022-04-07 MED ORDER — NA SULFATE-K SULFATE-MG SULF 17.5-3.13-1.6 GM/177ML PO SOLN: 1.0000 | ORAL | 0 refills | Status: DC

## 2022-04-07 NOTE — Progress Notes (Signed)
Patient's pre-visit was done today over the phone with the patient. Name,DOB and address verified. Patient denies any allergies to Eggs and Soy. Patient denies any problems with anesthesia/sedation. Patient is not taking any diet pills or blood thinners. No home Oxygen. Insurance confirmed with patient.  Prep instructions sent to pt's MyChart &  mailed to pt-pt is aware. Patient understands to call us back with any questions or concerns. Patient is aware of our care-partner policy.   EMMI education assigned to the patient for the procedure, sent to [facility].

## 2022-05-04 ENCOUNTER — Encounter: Payer: Self-pay | Admitting: Internal Medicine

## 2022-05-06 ENCOUNTER — Telehealth: Payer: Self-pay | Admitting: Internal Medicine

## 2022-05-06 NOTE — Telephone Encounter (Signed)
Patient called.  Her procedure is tomorrow.  She misread the instructions and started her prep at 6 a.m. this morning instead of 6 p.m. this evening.  She wants to know what she should do.  Please call and advise.  Thank you.

## 2022-05-06 NOTE — Telephone Encounter (Signed)
Called and spoke to patient. She drank the first half of her suprep at 6am this morning. Advised pt to get a '119mg'$  gram bottle of miralax and 32oz of gatorade. Told pt to drink 8oz of the mixture every 15 minutes starting at 5pm today, and to drink the second half of her Suprep as scheduled tomorrow morning. Pt verbalized understanding and had no further concerns at the end of the call.

## 2022-05-07 ENCOUNTER — Encounter: Payer: Self-pay | Admitting: Internal Medicine

## 2022-05-07 ENCOUNTER — Ambulatory Visit (AMBULATORY_SURGERY_CENTER): Payer: BC Managed Care – PPO | Admitting: Internal Medicine

## 2022-05-07 VITALS — BP 118/69 | HR 67 | Temp 97.8°F | Resp 9 | Ht 67.0 in | Wt 150.0 lb

## 2022-05-07 DIAGNOSIS — Z1211 Encounter for screening for malignant neoplasm of colon: Secondary | ICD-10-CM

## 2022-05-07 MED ORDER — SODIUM CHLORIDE 0.9 % IV SOLN: 500.0000 mL | Freq: Once | INTRAVENOUS | Status: DC

## 2022-05-07 NOTE — Progress Notes (Signed)
Pt's states no medical or surgical changes since previsit or office visit. 

## 2022-05-07 NOTE — Progress Notes (Signed)
GASTROENTEROLOGY PROCEDURE H&P NOTE   Primary Care Physician: Michela Pitcher, NP    Reason for Procedure:   Colon cancer screening  Plan:    Colonoscopy  Patient is appropriate for endoscopic procedure(s) in the ambulatory ([facility]) setting.  The nature of the procedure, as well as the risks, benefits, and alternatives were carefully and thoroughly reviewed with the patient. Ample time for discussion and questions allowed. The patient understood, was satisfied, and agreed to proceed.     HPI: Natalie Blair is a 46 y.o. female who presents for colonoscopy for colon cancer screening. Denies blood in stools, changes in bowel habits, weight loss. Denies fam hx of colon cancer.  Past Medical History:  Diagnosis Date   History of depression    History of headache    History of migraine    Sjogren's disease ([place])     Past Surgical History:  Procedure Laterality Date   BREAST REDUCTION SURGERY  2004   REDUCTION MAMMAPLASTY      Prior to Admission medications   Medication Sig Start Date End Date Taking? Authorizing Provider  Ascorbic Acid (VITAMIN C PO) Take 1 capsule by mouth daily.   Yes [provider]  Cholecalciferol (VITAMIN D3 PO) Take 1 capsule by mouth daily.   Yes [provider]  Ferrous Sulfate (IRON PO) Take by mouth.   Yes [provider]  hydroxychloroquine (PLAQUENIL) 200 MG tablet Take 1 tablet by mouth 2 (two) times daily. 06/29/21  Yes [provider]  NALTREXONE HCL PO Take by mouth. 2.5 mg once daily   Yes [provider]  Probiotic Product (PROBIOTIC PO) Take 1 capsule by mouth daily.    Yes [provider]  RESTASIS 0.05 % ophthalmic emulsion 1 drop 2 (two) times daily. 05/14/21  Yes [provider]  VYVANSE 20 MG capsule Take 20 mg by mouth daily. 03/24/22  Yes [provider]  guanFACINE (INTUNIV) 2 MG TB24 ER tablet Take 2 mg by mouth daily. 11/10/21   [provider]    Current  Outpatient Medications  Medication Sig Dispense Refill   Ascorbic Acid (VITAMIN C PO) Take 1 capsule by mouth daily.     Cholecalciferol (VITAMIN D3 PO) Take 1 capsule by mouth daily.     Ferrous Sulfate (IRON PO) Take by mouth.     hydroxychloroquine (PLAQUENIL) 200 MG tablet Take 1 tablet by mouth 2 (two) times daily.     NALTREXONE HCL PO Take by mouth. 2.5 mg once daily     Probiotic Product (PROBIOTIC PO) Take 1 capsule by mouth daily.      RESTASIS 0.05 % ophthalmic emulsion 1 drop 2 (two) times daily.     VYVANSE 20 MG capsule Take 20 mg by mouth daily.     guanFACINE (INTUNIV) 2 MG TB24 ER tablet Take 2 mg by mouth daily.     Current Facility-Administered Medications  Medication Dose Route Frequency Provider Last Rate Last Admin   0.9 %  sodium chloride infusion  500 mL Intravenous Once Sharyn Creamer, MD        Allergies as of 05/07/2022   (No Known Allergies)    Family History  Problem Relation Age of Onset   Depression Mother    Heart disease Father    Hypertension Father    Stroke Father    Pulmonary embolism Maternal Aunt        age 53 caused her to pass away   Breast cancer Maternal Grandmother  Colon cancer Neg Hx    Esophageal cancer Neg Hx    Rectal cancer Neg Hx    Stomach cancer Neg Hx     Social History   Socioeconomic History   Marital status: Single    Spouse name: Not on file   Number of children: Not on file   Years of education: Not on file   Highest education level: Not on file  Occupational History   Not on file  Tobacco Use   Smoking status: Never   Smokeless tobacco: Never  Vaping Use   Vaping Use: Never used  Substance and Sexual Activity   Alcohol use: Not Currently    Comment: socially/occ   Drug use: Not Currently   Sexual activity: Not on file  Other Topics Concern   Not on file  Social History Narrative   Not on file   Social Determinants of Health   Financial Resource Strain: Not on file  Food Insecurity: Not on  file  Transportation Needs: Not on file  Physical Activity: Not on file  Stress: Not on file  Social Connections: Not on file  Intimate Partner Violence: Not on file    Physical Exam: Vital signs in last 24 hours: BP 120/73   Pulse 73   Temp 97.8 F (36.6 C) (Skin)   Ht '5\' 7"'$  (1.702 m)   Wt 150 lb (68 kg)   SpO2 99%   BMI 23.49 kg/m  GEN: NAD EYE: Sclerae anicteric ENT: MMM CV: Non-tachycardic Pulm: No increased work of breathing GI: Soft, NT/ND NEURO:  Alert & Oriented   Christia Reading, MD [facility]  05/07/2022 7:59 AM

## 2022-05-07 NOTE — Progress Notes (Signed)
To pacu, VSS. Report to rn.tb °

## 2022-05-07 NOTE — Op Note (Signed)
[facility] Patient Name: Israel Wunder Procedure Date: 05/07/2022 8:01 AM MRN: [id number] Endoscopist: Sonny Masters "Christia Reading ,  Age: 46 Referring MD:  Date of Birth: [birth year] Gender: Female Account #: [id number] Procedure:                Colonoscopy Indications:              Screening for colorectal malignant neoplasm, This                            is the patient's first colonoscopy Medicines:                Monitored Anesthesia Care Procedure:                Pre-Anesthesia Assessment:                           - Prior to the procedure, a History and Physical                            was performed, and patient medications and                            allergies were reviewed. The patient's tolerance of                            previous anesthesia was also reviewed. The risks                            and benefits of the procedure and the sedation                            options and risks were discussed with the patient.                            All questions were answered, and informed consent                            was obtained. Prior Anticoagulants: The patient has                            taken no previous anticoagulant or antiplatelet                            agents. ASA Grade Assessment: II - A patient with                            mild systemic disease. After reviewing the risks                            and benefits, the patient was deemed in                            satisfactory condition to undergo the procedure.  After obtaining informed consent, the colonoscope                            was passed under direct vision. Throughout the                            procedure, the patient's blood pressure, pulse, and                            oxygen saturations were monitored continuously. The                            Olympus CF-HQ190L (Serial# [id number]) Colonoscope was                            introduced through the  anus and advanced to the the                            terminal ileum. The colonoscopy was performed                            without difficulty. The patient tolerated the                            procedure well. The quality of the bowel                            preparation was excellent. The terminal ileum,                            ileocecal valve, appendiceal orifice, and rectum                            were photographed. Scope In: 8:05:05 AM Scope Out: 8:19:48 AM Scope Withdrawal Time: 0 hours 11 minutes 45 seconds  Total Procedure Duration: 0 hours 14 minutes 43 seconds  Findings:                 The terminal ileum appeared normal.                           Non-bleeding internal hemorrhoids were found during                            retroflexion. Complications:            No immediate complications. Estimated Blood Loss:     Estimated blood loss: none. Impression:               - The examined portion of the ileum was normal.                           - Non-bleeding internal hemorrhoids.                           - No specimens collected. Recommendation:           -  Discharge patient to home (with escort).                           - Repeat colonoscopy in 10 years for screening                            purposes.                           - The findings and recommendations were discussed                            with the patient. Sonny Masters "Christia Reading,  05/07/2022 8:25:41 AM

## 2022-05-07 NOTE — Patient Instructions (Signed)
Read all of the handouts given to you by your recovery room nurse.  YOU HAD AN ENDOSCOPIC PROCEDURE TODAY AT [facility]:   Refer to the procedure report that was given to you for any specific questions about what was found during the examination.  If the procedure report does not answer your questions, please call your gastroenterologist to clarify.  If you requested that your care partner not be given the details of your procedure findings, then the procedure report has been included in a sealed envelope for you to review at your convenience later.  YOU SHOULD EXPECT: Some feelings of bloating in the abdomen. Passage of more gas than usual.  Walking can help get rid of the air that was put into your GI tract during the procedure and reduce the bloating. If you had a lower endoscopy (such as a colonoscopy or flexible sigmoidoscopy) you may notice spotting of blood in your stool or on the toilet paper. If you underwent a bowel prep for your procedure, you may not have a normal bowel movement for a few days.  Please Note:  You might notice some irritation and congestion in your nose or some drainage.  This is from the oxygen used during your procedure.  There is no need for concern and it should clear up in a day or so.  SYMPTOMS TO REPORT IMMEDIATELY:  Following lower endoscopy (colonoscopy or flexible sigmoidoscopy):  Excessive amounts of blood in the stool  Significant tenderness or worsening of abdominal pains  Swelling of the abdomen that is new, acute  Fever of 100F or higher   For urgent or emergent issues, a gastroenterologist can be reached at any hour by calling [phone]. Do not use MyChart messaging for urgent concerns.    DIET:  We do recommend a small meal at first, but then you may proceed to your regular diet.  Drink plenty of fluids but you should avoid alcoholic beverages for 24 hours.  ACTIVITY:  You should plan to take it easy for the rest of today and  you should NOT DRIVE or use heavy machinery until tomorrow (because of the sedation medicines used during the test).    FOLLOW UP: Our staff will call the number listed on your records the next business day following your procedure.  We will call around 7:15- 8:00 am to check on you and address any questions or concerns that you may have regarding the information given to you following your procedure. If we do not reach you, we will leave a message.  If you develop any symptoms (ie: fever, flu-like symptoms, shortness of breath, cough etc.) before then, please call [phone].  If you test positive for Covid 19 in the 2 weeks post procedure, please call and report this information to [place].     SIGNATURES/CONFIDENTIALITY: You and/or your care partner have signed paperwork which will be entered into your electronic medical record.  These signatures attest to the fact that that the information above on your After Visit Summary has been reviewed and is understood.  Full responsibility of the confidentiality of this discharge information lies with you and/or your care-partner.

## 2022-05-10 ENCOUNTER — Telehealth: Payer: Self-pay

## 2022-05-10 NOTE — Telephone Encounter (Signed)
Attempted f/u call. No answer, left VM. 

## 2022-12-13 ENCOUNTER — Ambulatory Visit: Payer: BC Managed Care – PPO | Admitting: Nurse Practitioner

## 2022-12-13 ENCOUNTER — Encounter: Payer: Self-pay | Admitting: Nurse Practitioner

## 2022-12-13 VITALS — BP 108/72 | HR 79 | Temp 98.2°F | Resp 16 | Ht 67.0 in | Wt 154.4 lb

## 2022-12-13 DIAGNOSIS — R0789 Other chest pain: Secondary | ICD-10-CM

## 2022-12-13 DIAGNOSIS — Z8249 Family history of ischemic heart disease and other diseases of the circulatory system: Secondary | ICD-10-CM | POA: Diagnosis not present

## 2022-12-13 DIAGNOSIS — M6289 Other specified disorders of muscle: Secondary | ICD-10-CM | POA: Diagnosis not present

## 2022-12-13 DIAGNOSIS — G4452 New daily persistent headache (NDPH): Secondary | ICD-10-CM | POA: Insufficient documentation

## 2022-12-13 MED ORDER — DEXAMETHASONE SODIUM PHOSPHATE 10 MG/ML IJ SOLN
10.0000 mg | Freq: Once | INTRAMUSCULAR | Status: AC
  Administered 2022-12-13: 10 mg via INTRAMUSCULAR

## 2022-12-13 MED ORDER — CYCLOBENZAPRINE HCL 5 MG PO TABS: 5.0000 mg | ORAL_TABLET | Freq: Two times a day (BID) | ORAL | 0 refills | Status: AC | PRN

## 2022-12-13 NOTE — Progress Notes (Signed)
Acute Office Visit  Subjective:     Patient ID: Natalie Blair, female    DOB: Jan 10, 1976, 47 y.o.   MRN: [id number]  Chief Complaint  Patient presents with   Headache    X over a week Sharp stabbing on the right side general but happens on both sides.     Patient is in today for headache with a history of Sjogrens syndrome, leukopneia  Symptoms started on last Saturday  approx 10 days ago States that she does not have her cycle regualrl but she did have her cycle State that she did wake up with it. It is right temple over the eye. States some days it does wake her up and some days she just wakes iup with it. States that the discomfort is constant. States that the all the time feels like a pressure and intermittent stabbing pains. States that she has tried excedrin and BC with out good relief  States that she does have a upset stomach. No vomiting. States that the does have achiness going down the back the head and going down the neck. Patient is experiencing light sensitivity but denies sound sensitivity.  Atypial chest pain: States that she has thoracic pain that makes a heaviness in her chest. States that she has dealt with it for several months. No chest pain. No personal histoory of DVT but materanl aunt died of blood clot and sister had dvt.  Patient is not aware if there is any clotting disorder in the family.  She also mentioned having some palpitations intermittently  4 liters of water a day.  Review of Systems  Constitutional:  Negative for chills and fever.  Respiratory:  Negative for shortness of breath.   Cardiovascular:  Positive for palpitations. Negative for chest pain.  Genitourinary:  Negative for dysuria and hematuria.  Neurological:  Positive for headaches.        Objective:    BP 108/72   Pulse 79   Temp 98.2 F (36.8 C)   Resp 16   Ht '5\' 7"'$  (1.702 m)   Wt 154 lb 6 oz (70 kg)   SpO2 100%   BMI 24.18 kg/m    Physical Exam Vitals and nursing  note reviewed.  Constitutional:      Appearance: Normal appearance.  HENT:     Head:      Mouth/Throat:     Mouth: Mucous membranes are moist.     Pharynx: Oropharynx is clear.  Eyes:     Extraocular Movements: Extraocular movements intact.     Pupils: Pupils are equal, round, and reactive to light.  Cardiovascular:     Rate and Rhythm: Normal rate and regular rhythm.     Heart sounds: Normal heart sounds.  Pulmonary:     Effort: Pulmonary effort is normal.     Breath sounds: Normal breath sounds.  Musculoskeletal:       Arms:     Comments: Thoracic spine discomfort nonreproducible in office.  With patient movement or palpation  Neurological:     General: No focal deficit present.     Mental Status: She is alert.     Cranial Nerves: Cranial nerves 2-12 are intact.     Deep Tendon Reflexes:     Reflex Scores:      Bicep reflexes are 2+ on the right side and 2+ on the left side.      Patellar reflexes are 2+ on the right side and 2+ on the left side.  Comments: Bilateral upper and lower extremity strength 5/5     No results found for any visits on 12/13/22.      Assessment & Plan:   Problem List Items Addressed This Visit       Other   Atypical chest pain    Have talked about this several times in the past.  Did chest x-ray approximate 1 year ago that was within normal limits.  Check basic labs today      Relevant Orders   CBC   Comprehensive metabolic panel   Family history of blood clots    With chest heaviness and mid back pain D-dimer pending result      Relevant Orders   D-dimer, quantitative   Muscle tightness    Bilateral trapezius muscles tight on palpation.  Flexeril 5 mg nightly as needed      Relevant Medications   cyclobenzaprine (FLEXERIL) 5 MG tablet   New daily persistent headache - Primary    Will try patient with Flexeril 5 mg nightly as needed.  Sedation precautions reviewed also Decadron 10 mg IM x 1 dose.  If she does not start  feeling better by Wednesday she will reach out with consider CT scan for intractable headache.  Neurological exam benign in office.      Relevant Medications   cyclobenzaprine (FLEXERIL) 5 MG tablet    Meds ordered this encounter  Medications   dexamethasone (DECADRON) injection 10 mg   cyclobenzaprine (FLEXERIL) 5 MG tablet    Sig: Take 1 tablet (5 mg total) by mouth 2 (two) times daily as needed for muscle spasms.    Dispense:  15 tablet    Refill:  0    Order Specific Question:   Supervising Provider    Answer:   Loura Pardon A [[id number]]    Return in about 6 weeks (around 01/24/2023) for CPE and Labs.  Romilda Garret, NP

## 2022-12-13 NOTE — Patient Instructions (Signed)
Nice to see you today The muscle relaxer will make you sleepy, use caution  If you are not improving by Wednesday let me know Follow up int he next couple months for your physical

## 2022-12-13 NOTE — Assessment & Plan Note (Signed)
Will try patient with Flexeril 5 mg nightly as needed.  Sedation precautions reviewed also Decadron 10 mg IM x 1 dose.  If she does not start feeling better by Wednesday she will reach out with consider CT scan for intractable headache.  Neurological exam benign in office.

## 2022-12-13 NOTE — Assessment & Plan Note (Signed)
Bilateral trapezius muscles tight on palpation.  Flexeril 5 mg nightly as needed

## 2022-12-13 NOTE — Assessment & Plan Note (Signed)
With chest heaviness and mid back pain D-dimer pending result

## 2022-12-13 NOTE — Assessment & Plan Note (Signed)
Have talked about this several times in the past.  Did chest x-ray approximate 1 year ago that was within normal limits.  Check basic labs today

## 2022-12-14 LAB — COMPREHENSIVE METABOLIC PANEL
ALT: 8 U/L (ref 0–35)
AST: 16 U/L (ref 0–37)
Albumin: 4.2 g/dL (ref 3.5–5.2)
Alkaline Phosphatase: 30 U/L — ABNORMAL LOW (ref 39–117)
BUN: 7 mg/dL (ref 6–23)
CO2: 26 mEq/L (ref 19–32)
Calcium: 8.9 mg/dL (ref 8.4–10.5)
Chloride: 104 mEq/L (ref 96–112)
Creatinine, Ser: 0.82 mg/dL (ref 0.40–1.20)
GFR: 84.21 mL/min (ref >60.00)
Glucose, Bld: 72 mg/dL (ref 70–99)
Potassium: 4.2 mEq/L (ref 3.5–5.1)
Sodium: 140 mEq/L (ref 135–145)
Total Bilirubin: 0.3 mg/dL (ref 0.2–1.2)
Total Protein: 7.5 g/dL (ref 6.0–8.3)

## 2022-12-14 LAB — CBC
HCT: 40.8 % (ref 36.0–46.0)
Hemoglobin: 14.1 g/dL (ref 12.0–15.0)
MCHC: 34.6 g/dL (ref 30.0–36.0)
MCV: 91.9 fl (ref 78.0–100.0)
Platelets: 260 10*3/uL (ref 150.0–400.0)
RBC: 4.44 Mil/uL (ref 3.87–5.11)
RDW: 13.3 % (ref 11.5–15.5)
WBC: 3.8 10*3/uL — ABNORMAL LOW (ref 4.0–10.5)

## 2022-12-14 LAB — D-DIMER, QUANTITATIVE: D-Dimer, Quant: <0.19 mcg/mL FEU (ref <0.50)

## 2022-12-16 ENCOUNTER — Encounter: Payer: Self-pay | Admitting: Nurse Practitioner

## 2022-12-19 ENCOUNTER — Encounter: Payer: Self-pay | Admitting: Nurse Practitioner

## 2022-12-19 DIAGNOSIS — G4452 New daily persistent headache (NDPH): Secondary | ICD-10-CM

## 2022-12-20 NOTE — Telephone Encounter (Signed)
Message sent to patient to get more information. Looks like on your last note you had talked about a CT. I have asked about location of that in message as well.

## 2022-12-20 NOTE — Telephone Encounter (Signed)
We can await her response and then I will make a decision

## 2022-12-27 NOTE — Telephone Encounter (Signed)
Patient has been scheduled.

## 2022-12-27 NOTE — Telephone Encounter (Signed)
Can we schedule her for another office visit for re assessment please

## 2022-12-28 MED ORDER — BUTALBITAL-APAP-CAFFEINE 50-325-40 MG PO TABS: 1.0000 | ORAL_TABLET | Freq: Two times a day (BID) | ORAL | 0 refills | Status: AC | PRN

## 2022-12-28 NOTE — Addendum Note (Signed)
Addended by: Michela Pitcher on: 12/28/2022 01:50 PM   Modules accepted: Orders

## 2022-12-31 ENCOUNTER — Ambulatory Visit: Payer: BC Managed Care – PPO | Admitting: Nurse Practitioner

## 2023-04-21 IMAGING — DX DG CHEST 2V
2 series · 2 of 2 positions shown · non-contrast
Comparison: None.

CLINICAL DATA: Right chest pain.

EXAM:
CHEST - 2 VIEW

[chest pa]
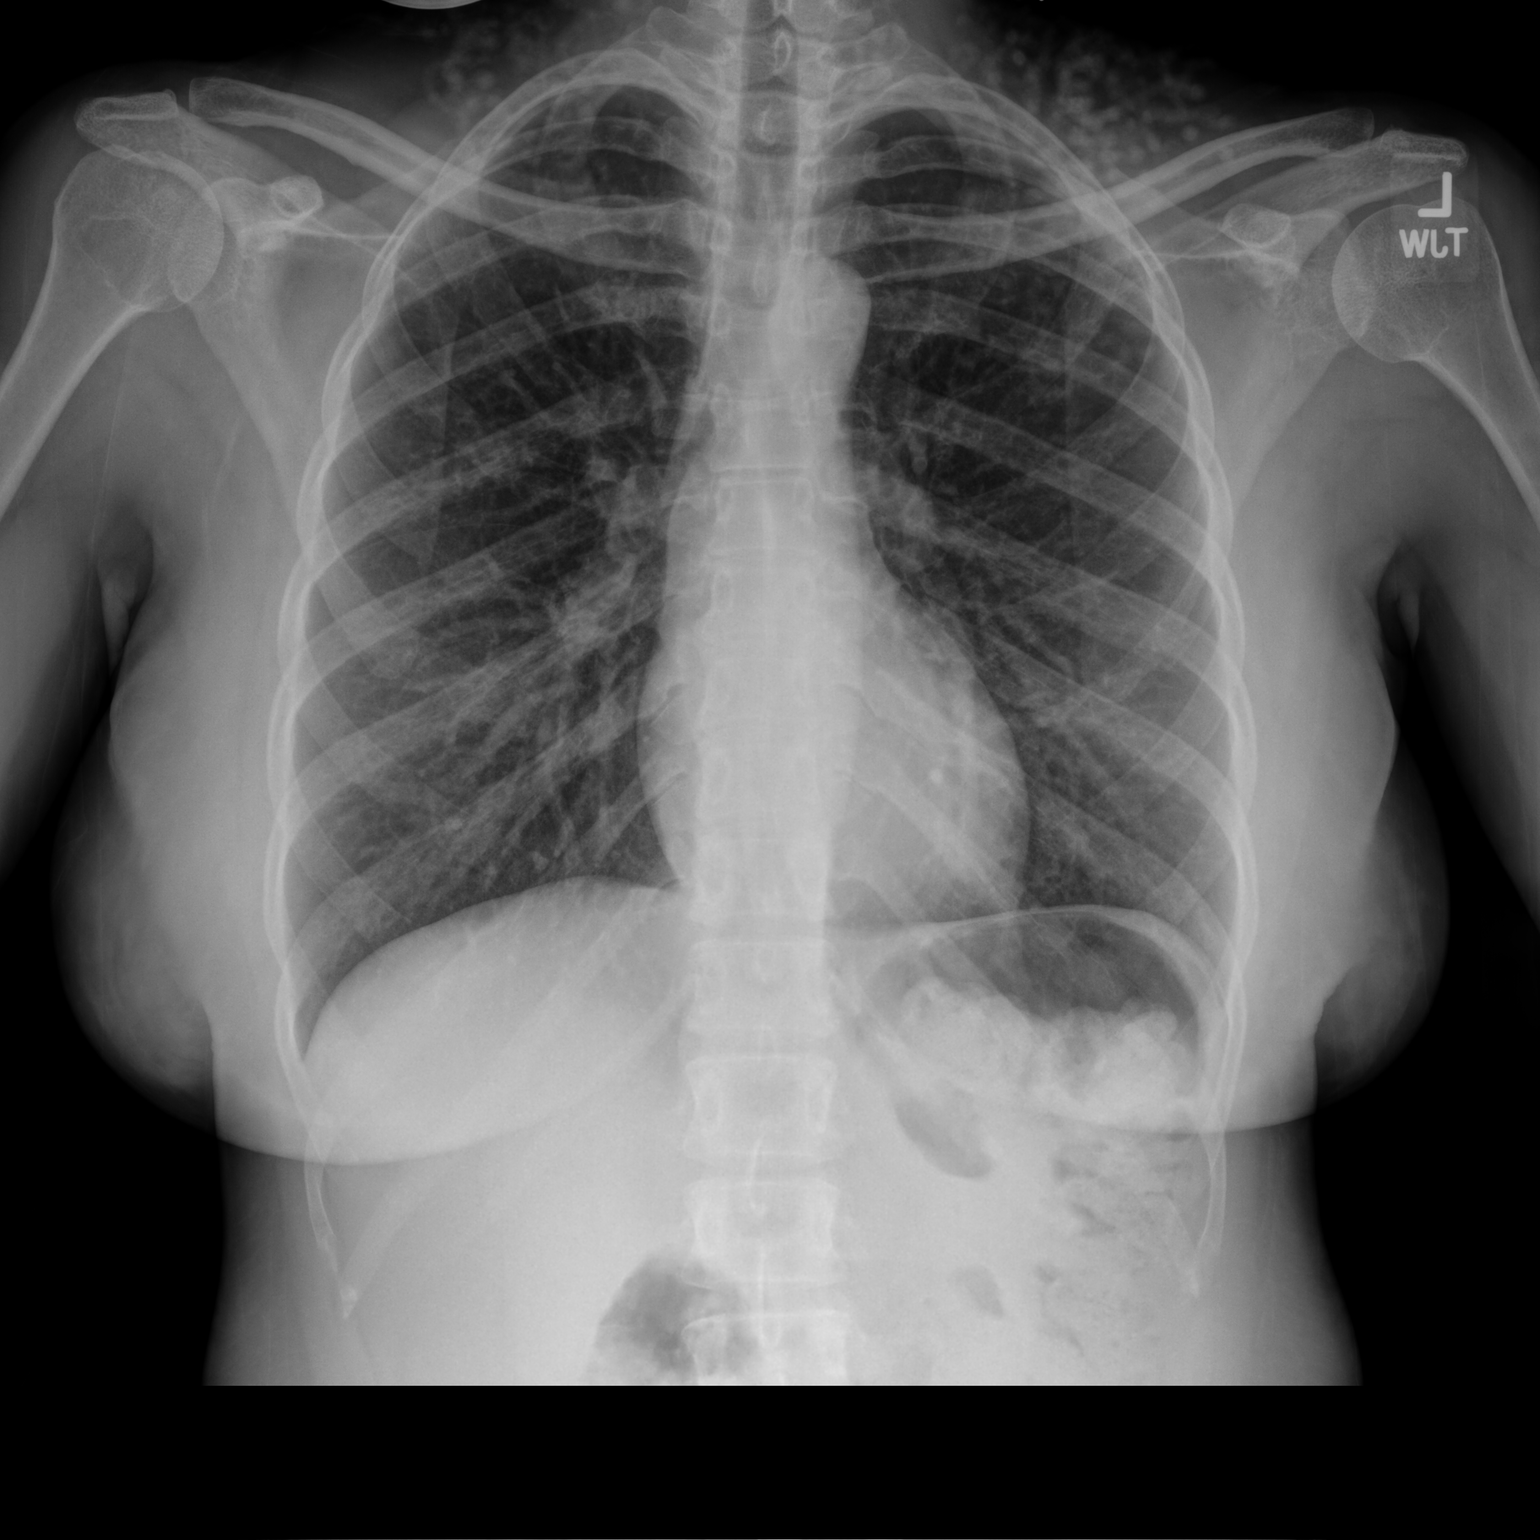

[chest lat]
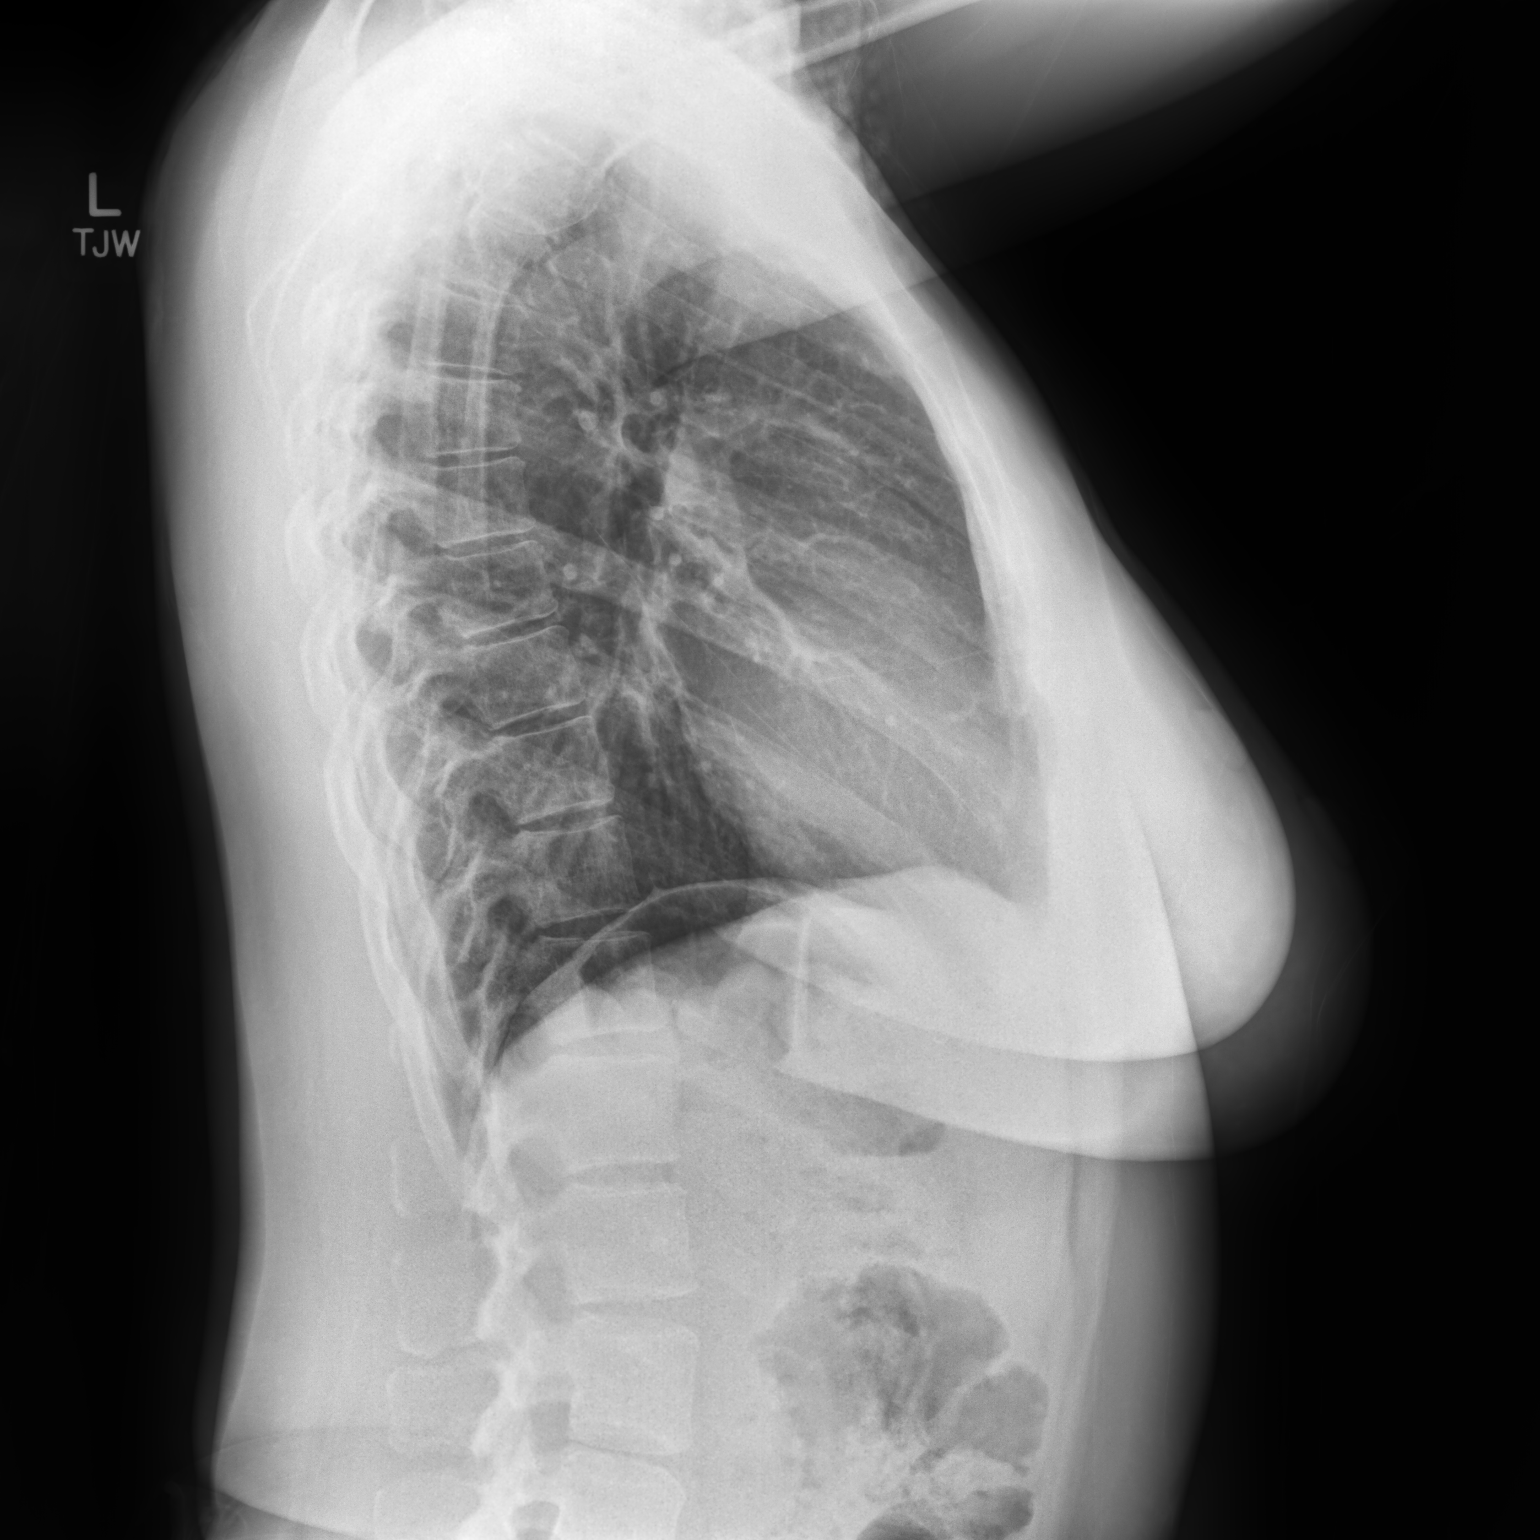

[2 of 2 positions shown; findings below may reference images not displayed]

FINDINGS: Cardiomediastinal silhouette and pulmonary vasculature are normal in
appearance. Lungs are clear. Evaluation of the neck base and upper
chest limited due to overlying external artifact likely related to
hair.
IMPRESSION: No acute cardiopulmonary process.

## 2023-04-22 ENCOUNTER — Telehealth: Payer: Self-pay | Admitting: Nurse Practitioner

## 2023-04-22 NOTE — Telephone Encounter (Signed)
Noted  

## 2023-04-22 NOTE — Telephone Encounter (Signed)
Unable to reach pt by phone and left v/m requesting pt to call [phone]. No other contact # listed on pts chart; no emergency contact. Sending note to Audria Nine NP [place] pool and lsc triage.

## 2023-04-22 NOTE — Telephone Encounter (Signed)
Pt called back and she has not gone to ED; pt said she has been weighing her options; I asked what that meant since pt is having more frequent CP with heart palpitations and should have evaluated. Pt voiced understanding and pt said she had appt at 4 pm and then pt would go to ED after that. UC & ED precautions given again and pt voiced understanding. Sending FYI to Audria Nine NP.

## 2023-04-22 NOTE — Telephone Encounter (Signed)
Per access nurse note pt agreed to go to ED. Sending note to Matt Cable NP and [facility] pool. 

## 2023-04-22 NOTE — Telephone Encounter (Signed)
Noted. I do not see where she has went to the ED as of yet

## 2023-04-22 NOTE — Telephone Encounter (Signed)
FYI: This call has been transferred to Access Nurse. Once the result note has been entered staff can address the message at that time.  Patient called in with the following symptoms:  Red Word:chest pain   Please advise at [facility] [phone]  Message is routed to Provider Pool and [facility] Triage

## 2023-04-25 ENCOUNTER — Telehealth: Payer: Self-pay

## 2023-04-25 NOTE — Transitions of Care (Post Inpatient/ED Visit) (Signed)
   04/25/2023  Name: Natalie Blair MRN: [id number] DOB: 10/02/1976  Today's TOC FU Call Status: Today's TOC FU Call Status:: Unsuccessul Call (1st Attempt) Unsuccessful Call (1st Attempt) Date: 04/25/23  Attempted to reach the patient regarding the most recent Inpatient/ED visit.  Follow Up Plan: Additional outreach attempts will be made to reach the patient to complete the Transitions of Care (Post Inpatient/ED visit) call.   Signature Elisha Ponder LPN [facility] AWV Team Direct dial:  [phone]

## 2023-04-28 NOTE — Transitions of Care (Post Inpatient/ED Visit) (Signed)
I spoke with Natalie Blair;Natalie Blair was seen [facility] on 04/23/23 for intermittent CP; Natalie Blair saw ED doctor but could not wait any longer and Natalie Blair left AMA. Natalie Blair said last lt sided CP was 04/27/23.No N&V and no SOB. Natalie Blair has intermittent lt elbow to shoulder blade pain that is separate event from CP. Offered Natalie Blair sooner appt but Natalie Blair scheduled appt with Audria Nine NP on 05/02/23 at 10:20 AM with UC & ED precautions and Natalie Blair voiced understanding and appreciative of call. Sending note to Caroline Sauger NP.    04/28/2023  Name: Natalie Blair MRN: [id number] DOB: 1975-12-30  Today's TOC FU Call Status: Today's TOC FU Call Status:: Successful TOC FU Call Competed Unsuccessful Call (1st Attempt) Date: 04/25/23 [facility] FU Call Complete Date: 04/28/23  Transition Care Management Follow-up Telephone Call Date of Discharge: 04/23/23 Discharge Facility: Other ([facility]) Name of Other ([facility]) Discharge Facility: [facility] Type of Discharge: Emergency Department Reason for ED Visit: Cardiac Conditions (CP; Natalie Blair saw doctor but could not wait any longer and Natalie Blair left AMA.) Cardiac Conditions Diagnosis: Chest Pain Persisting How have you been since you were released from the hospital?: Same (Natalie Blair still having iintermittent lt sided cp and palpitations and no radiation of pain. No SOB. sometimes achiness from left elbow to shoulder blade but does not occur when having CP; separate event.,) Any questions or concerns?: No  Items Reviewed: Did you receive and understand the discharge instructions provided?: No (did not give dc instructions since left AMA.) Medications obtained,verified, and reconciled?: Yes (Medications Reviewed) (Natalie Blair reviewed med list and is aware how to take meds. no new med given at ED.) Any new allergies since your discharge?: No Dietary orders reviewed?: NA Do you have support at home?: Yes People in Home: child(ren), adult Name of Support/Comfort Primary Source: Natalie Blair  Medications Reviewed Today: Medications Reviewed Today      Reviewed by Eden Emms, NP (Nurse Practitioner) on 12/13/22 at 1644  Med List Status: <None>   Medication Order Taking? Sig Documenting Provider Last Dose Status Informant  Ascorbic Acid (VITAMIN C PO) [id number] Yes Take 1 capsule by mouth daily. [provider] Taking Active   Cholecalciferol (VITAMIN D3 PO) [id number] Yes Take 1 capsule by mouth daily. [provider] Taking Active   cyclobenzaprine (FLEXERIL) 5 MG tablet [id number] Yes Take 1 tablet (5 mg total) by mouth 2 (two) times daily as needed for muscle spasms. Eden Emms, NP  Active   dexamethasone (DECADRON) injection 10 mg [id number]   Eden Emms, NP  Expired 12/13/22 1534   Ferrous Sulfate (IRON PO) [id number] Yes Take by mouth. [provider] Taking Active   guanFACINE (INTUNIV) 2 MG TB24 ER tablet [id number]  Take 2 mg by mouth daily. [provider]  Consider Medication Status and Discontinue (Patient Preference)   hydroxychloroquine (PLAQUENIL) 200 MG tablet [id number] Yes Take 1 tablet by mouth 2 (two) times daily. [provider] Taking Active   NALTREXONE HCL PO [id number] Yes Take by mouth. 2.5 mg once daily [provider] Taking Active   Probiotic Product (PROBIOTIC PO) [id number] Yes Take 1 capsule by mouth daily.  [provider] Taking Active   RESTASIS 0.05 % ophthalmic emulsion [id number] Yes 1 drop 2 (two) times daily. [provider] Taking Active   VYVANSE 20 MG capsule [id number] Yes Take 20 mg by mouth daily. [provider] Taking Active             Home Care and Equipment/Supplies: Were  Home Health Services Ordered?: NA Any new equipment or medical supplies ordered?: NA  Functional Questionnaire: Do you need assistance with bathing/showering or dressing?: No Do you need assistance with meal preparation?: No Do you need assistance with eating?: No Do you have difficulty maintaining continence: No Do you need  assistance with getting out of bed/getting out of a chair/moving?: No Do you have difficulty managing or taking your medications?: No  Follow up appointments reviewed: PCP Follow-up appointment confirmed?: Yes Date of PCP follow-up appointment?: 05/02/23 Follow-up Provider: Eden Emms NP Do you need transportation to your follow-up appointment?: No Do you understand care options if your condition(s) worsen?: Yes-patient verbalized understanding    SIGNATURE Lewanda Rife, LPN

## 2023-04-28 NOTE — Telephone Encounter (Signed)
Noted! Thank you

## 2023-05-02 ENCOUNTER — Ambulatory Visit: Payer: BC Managed Care – PPO | Admitting: Nurse Practitioner

## 2023-07-21 ENCOUNTER — Ambulatory Visit: Payer: 59 | Admitting: Internal Medicine

## 2023-07-21 ENCOUNTER — Encounter: Payer: Self-pay | Admitting: Internal Medicine

## 2023-07-21 VITALS — BP 104/74 | HR 85 | Temp 97.7°F | Ht 67.0 in | Wt 153.0 lb

## 2023-07-21 DIAGNOSIS — U071 COVID-19: Secondary | ICD-10-CM

## 2023-07-21 MED ORDER — ONDANSETRON HCL 4 MG PO TABS: 4.0000 mg | ORAL_TABLET | Freq: Three times a day (TID) | ORAL | 1 refills | Status: AC | PRN

## 2023-07-21 NOTE — Assessment & Plan Note (Signed)
Lingering fatigue and now nausea (which is new but not explained by medications, etc) Discussed adequate rest---breaks from work if needed, etc Regular tylenol Will give ondansetron for nausea

## 2023-07-21 NOTE — Progress Notes (Signed)
Subjective:    Patient ID: Natalie Blair, female    DOB: 04-Jun-1976, 47 y.o.   MRN: [id number]  HPI Here due to persistent symptoms from COVID infection  First symptoms 9/1---fatigue, etc Headache also---and this has persisted No sig respiratory symptoms Home test positive 9/3 Rested--taking nyquil but off this now  Still having problems with fatigue--has to lie down after being up a little Nausea--vomiting after eating. 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     butalbital-acetaminophen-caffeine (FIORICET) 50-325-40 MG tablet Take 1 tablet by mouth 2 (two) times daily as needed for headache. 14 tablet 0   Cholecalciferol (VITAMIN D3 PO) Take 1 capsule by mouth daily.     cyclobenzaprine (FLEXERIL) 5 MG tablet Take 1 tablet (5 mg total) by mouth 2 (two) times daily as needed for muscle spasms. 15 tablet 0   Ferrous Sulfate (IRON PO) Take by mouth.     hydroxychloroquine (PLAQUENIL) 200 MG tablet Take 1 tablet by mouth 2 (two) times daily.     NALTREXONE HCL PO Take by mouth. 2.5 mg once daily     Probiotic Product (PROBIOTIC PO) Take 1 capsule by mouth daily.      RESTASIS 0.05 % ophthalmic emulsion 1 drop 2 (two) times daily.     VYVANSE 20 MG capsule Take 20 mg by mouth daily.     No current facility-administered medications on file prior to visit.    No Known Allergies  Past Medical History:  Diagnosis Date   History of depression    History of headache    History of migraine    Sjogren's disease (HCC)     Past Surgical History:  Procedure Laterality  Date   BREAST REDUCTION SURGERY  2004   REDUCTION MAMMAPLASTY      Family History  Problem Relation Age of Onset   Depression Mother    Heart disease Father    Hypertension Father    Stroke Father    Pulmonary embolism Maternal Aunt        age 37 caused her to pass away   Breast cancer Maternal Grandmother    Colon cancer Neg Hx    Esophageal cancer Neg Hx    Rectal cancer Neg Hx    Stomach cancer Neg Hx     Social History   Socioeconomic History   Marital status: Single    Spouse name: Not on file   Number of children: Not on file   Years of education: Not on file   Highest education level: Not on file  Occupational History   Not on file  Tobacco Use   Smoking status: Never   Smokeless tobacco: Never  Vaping Use   Vaping status: Never Used  Substance and Sexual Activity   Alcohol use: Not Currently    Comment: socially/occ   Drug use: Not Currently   Sexual activity: Not on file  Other Topics Concern   Not on file  Social History Narrative   Not on file   Social Determinants of Health   Financial Resource Strain:  Not on file  Food Insecurity: Not on file  Transportation Needs: Not on file  Physical Activity: Not on file  Stress: Not on file  Social Connections: Not on file  Intimate Partner Violence: Not on file   Review of Systems Drinking water--gatorade at first Has lost 5# or so Soft stools--but not every day    Objective:   Physical Exam Constitutional:      Appearance: Normal appearance.  HENT:     Mouth/Throat:     Pharynx: No oropharyngeal exudate or posterior oropharyngeal erythema.  Pulmonary:     Effort: Pulmonary effort is normal.     Breath sounds: Normal breath sounds. No wheezing or rales.  Abdominal:     Palpations: Abdomen is soft.     Tenderness: There is no abdominal tenderness. There is no guarding.  Musculoskeletal:     Cervical back: Neck supple.  Lymphadenopathy:     Cervical: No cervical adenopathy.  Neurological:      Mental Status: She is alert.            Assessment & Plan:

## 2023-09-28 ENCOUNTER — Ambulatory Visit: Payer: 59 | Admitting: General Practice

## 2023-09-28 ENCOUNTER — Encounter: Payer: Self-pay | Admitting: General Practice

## 2023-09-28 VITALS — BP 124/86 | HR 85 | Temp 98.6°F | Ht 67.0 in | Wt 158.0 lb

## 2023-09-28 DIAGNOSIS — R051 Acute cough: Secondary | ICD-10-CM

## 2023-09-28 MED ORDER — BENZONATATE 200 MG PO CAPS: 200.0000 mg | ORAL_CAPSULE | Freq: Three times a day (TID) | ORAL | 0 refills | Status: AC | PRN

## 2023-09-28 NOTE — Patient Instructions (Addendum)
Start Benzonatate 200 mg TID as needed.  Please update if your symptoms worsen or do not improve in 2-3 days.   You can try a few things over the counter to help with your symptoms including:  Cough: Delsym or Robitussin (get the off brand, works just as well) Chest Congestion: Mucinex (plain) Nasal Congestion/Ear Pressure/Sinus Pressure: Try using Flonase (fluticasone) nasal spray. Instill 1 spray in each nostril twice daily. This can be purchased over the counter. Body aches, fevers, headache: Ibuprofen (not to exceed 2400 mg in 24 hours) or Acetaminophen-Tylenol (not to exceed 3000 mg in 24 hours) Runny Nose/Throat Drainage/Sneezing/Itchy or Watery Eyes: An antihistamine such as Zyrtec, Claritin, Xyzal, Allegra  You should be feeling better by day seven of symptoms, but please do contact me if this is not the case.  It was a pleasure seeing you today.

## 2023-09-28 NOTE — Progress Notes (Signed)
Established Patient Office Visit  Subjective   Patient ID: Natalie Blair, female    DOB: [birth year]  Age: 47 y.o. MRN: [id number]  Chief Complaint  Patient presents with   Cough    Started 11/15 Cough, fever (not today) , hoarse, chest congestion, sore throat (from coughing), fatigue  Covid test was negative on 11/15.     HPI  Natalie Blair is a 47 year old female, patient of Mordecai Maes, NP presents today for an acute visit.   1) Cough: Symptoms started 11/15 with cough, fever, hoarseness, chest congestion, sore throat from coughing, chills, fatigue and frontal headache. She did not check her fever. She does report dyspnea with exertion. She completed a home covid test on 11/15 and it was negative.   Today, she reports that she is feeling better. Her fever and chills have resolved. Her fatigue has improved. However, her cough has worsen. It is worse at night. Some post nasal drainage. She has been coughing up moderate amounts of sputum which at times has been yellow or green in color.  She has tried OTC Designer, industrial/product plus every four hours since symptom onset. She has had minimal relief that would last about three hours and then her symptoms would return. She has also been using the lozenges which seem to help a little. She was COVID positive in September, 2024. No history of smoking.   Patient Active Problem List   Diagnosis Date Noted   Acute cough 09/28/2023   COVID-19 virus infection 07/21/2023   Family history of blood clots 12/13/2022   Muscle tightness 12/13/2022   New daily persistent headache 12/13/2022   Leukopenia 10/28/2021   Atypical chest pain 09/10/2021   Preventative health care 09/10/2021   Sjogren's syndrome (HCC) 09/10/2021   Allergy to insect venom 09/10/2021   Injury of median nerve at left forearm level 09/21/2018   Past Medical History:  Diagnosis Date   History of depression    History of headache    History of migraine    Sjogren's disease (HCC)     No Known Allergies    Review of Systems  Constitutional:  Negative for chills and fever.  HENT:  Positive for sore throat. Negative for congestion, ear pain and sinus pain.   Respiratory:  Positive for cough, sputum production and shortness of breath. Negative for wheezing.        Hoarse  Cardiovascular:  Negative for chest pain.  Gastrointestinal:  Negative for heartburn, nausea and vomiting.  Neurological:  Positive for headaches.      Objective:     BP 124/86   Pulse 85   Temp 98.6 F (37 C) (Oral)   Ht 5\' 7"  (1.702 m)   Wt 158 lb (71.7 kg)   SpO2 99%   BMI 24.75 kg/m  BP Readings from Last 3 Encounters:  09/28/23 124/86  07/21/23 104/74  12/13/22 108/72      Physical Exam Vitals and nursing note reviewed.  Constitutional:      Appearance: Normal appearance.  HENT:     Right Ear: Tympanic membrane, ear canal and external ear normal.     Left Ear: Tympanic membrane, ear canal and external ear normal.     Nose: No congestion or rhinorrhea.     Mouth/Throat:     Mouth: Mucous membranes are moist.     Pharynx: No oropharyngeal exudate or posterior oropharyngeal erythema.  Cardiovascular:     Rate and Rhythm: Normal rate and regular rhythm.  Pulmonary:  Effort: Pulmonary effort is normal. No respiratory distress.     Breath sounds: Normal breath sounds. No wheezing or rhonchi.  Skin:    General: Skin is warm and dry.  Neurological:     Mental Status: She is alert.  Psychiatric:        Mood and Affect: Mood normal.        Behavior: Behavior normal.      No results found for any visits on 09/28/23.     The 10-year ASCVD risk score (Arnett DK, et al., 2019) is: 0.9%    Assessment & Plan:   Problem List Items Addressed This Visit       Other   Acute cough - Primary    Bacterial vs Viral bronchitis  Given her symptoms are improving and no alarming signs on exam; will treat conservatively.    Start Benzonatate 200 mg TID PRN. Prescription  sent by Mayra Reel, NP.  Recommended OTC Delsym or Robitussin.  She will update in the next 2-3 days if symptoms worsen or do not improve.         Relevant Medications   benzonatate (TESSALON) 200 MG capsule    No follow-ups on file.    [place] Charon, NP

## 2023-09-28 NOTE — Addendum Note (Signed)
Addended by: [place] Charon on: 09/28/2023 04:36 PM   Modules accepted: Level of Service

## 2023-09-28 NOTE — Assessment & Plan Note (Addendum)
Bacterial vs Viral bronchitis  Given her symptoms are improving and no alarming signs on exam; will treat conservatively.    Start Benzonatate 200 mg TID PRN. Prescription sent by Mayra Reel, NP.  Recommended OTC Delsym or Robitussin.  She will update in the next 2-3 days if symptoms worsen or do not improve.

## 2023-10-03 ENCOUNTER — Ambulatory Visit: Payer: 59 | Admitting: Primary Care

## 2023-10-03 ENCOUNTER — Encounter: Payer: Self-pay | Admitting: Primary Care

## 2023-10-03 VITALS — BP 118/78 | HR 82 | Temp 97.2°F | Ht 67.0 in | Wt 159.0 lb

## 2023-10-03 DIAGNOSIS — R051 Acute cough: Secondary | ICD-10-CM

## 2023-10-03 DIAGNOSIS — T3695XA Adverse effect of unspecified systemic antibiotic, initial encounter: Secondary | ICD-10-CM

## 2023-10-03 DIAGNOSIS — B379 Candidiasis, unspecified: Secondary | ICD-10-CM

## 2023-10-03 MED ORDER — FLUCONAZOLE 150 MG PO TABS: 150.0000 mg | ORAL_TABLET | Freq: Once | ORAL | 0 refills | Status: AC

## 2023-10-03 MED ORDER — AMOXICILLIN-POT CLAVULANATE 875-125 MG PO TABS: 1.0000 | ORAL_TABLET | Freq: Two times a day (BID) | ORAL | 0 refills | Status: AC

## 2023-10-03 NOTE — Progress Notes (Signed)
Subjective:    Patient ID: Natalie Blair, female    DOB: 1976-05-21, 47 y.o.   MRN: [id number]  Cough Pertinent negatives include no chest pain, chills, fever, headaches, postnasal drip or sore throat.    Natalie Blair is a very pleasant 47 y.o. female patient of Matt, NP with a history of Sjogren's syndrome, leukopenia, COVID-19 infection who presents today   Evaluated by Norma Fredrickson, NP on 09/28/2023 for a 5-day history of URI symptoms including cough, fever, voice hoarseness, chest congestion, sore throat, fatigue, chills, headaches.  Home COVID-19 test was negative.  Symptoms were improving so she was treated conservatively with Tessalon Perles 200 mg 3 times daily as needed.   Today her symptoms have persisted. She is not feeling better. Symptoms include cough, chest congestion, fatigue. Her cough is productive with yellow sputum.   She's been taking Alka Seltzer Plus, Delsym, Mucinex, Tessalon Perles with little improvement. She denies fevers, sinus pressure, post nasal drip.  She has a history of antibiotic induced vaginal yeast infection and is requesting fluconazole.   Review of Systems  Constitutional:  Positive for fatigue. Negative for chills and fever.  HENT:  Positive for congestion. Negative for postnasal drip, sinus pressure and sore throat.   Respiratory:  Positive for cough and chest tightness.   Cardiovascular:  Negative for chest pain.  Neurological:  Negative for headaches.         Past Medical History:  Diagnosis Date   History of depression    History of headache    History of migraine    Sjogren's disease (HCC)     Social History   Socioeconomic History   Marital status: Single    Spouse name: Not on file   Number of children: Not on file   Years of education: Not on file   Highest education level: Not on file  Occupational History   Not on file  Tobacco Use   Smoking status: Never   Smokeless tobacco: Never  Vaping Use   Vaping status: Never  Used  Substance and Sexual Activity   Alcohol use: Not Currently    Comment: socially/occ   Drug use: Not Currently   Sexual activity: Not on file  Other Topics Concern   Not on file  Social History Narrative   Not on file   Social Determinants of Health   Financial Resource Strain: Low Risk  (07/26/2023)   Received from [facility]   Overall Financial Resource Strain (CARDIA)    Difficulty of Paying Living Expenses: Not hard at all  Food Insecurity: No Food Insecurity (07/26/2023)   Received from [facility]   Hunger Vital Sign    Worried About Running Out of Food in the Last Year: Never true    Ran Out of Food in the Last Year: Never true  Transportation Needs: No Transportation Needs (07/26/2023)   Received from [facility] Transportation    In the past 12 months, has lack of transportation kept you from medical appointments or from getting medications?: No    Lack of Transportation (Non-Medical): No  Physical Activity: Not on file  Stress: Not on file  Social Connections: Not on file  Intimate Partner Violence: Not on file    Past Surgical History:  Procedure Laterality Date   BREAST REDUCTION SURGERY  2004   REDUCTION MAMMAPLASTY      Family History  Problem Relation Age of Onset   Depression Mother  Heart disease Father    Hypertension Father    Stroke Father    Pulmonary embolism Maternal Aunt        age 71 caused her to pass away   Breast cancer Maternal Grandmother    Colon cancer Neg Hx    Esophageal cancer Neg Hx    Rectal cancer Neg Hx    Stomach cancer Neg Hx     No Known Allergies  Current Outpatient Medications on File Prior to Visit  Medication Sig Dispense Refill   Ascorbic Acid (VITAMIN C PO) Take 1 capsule by mouth daily.     benzonatate (TESSALON) 200 MG capsule Take 1 capsule (200 mg total) by mouth 3 (three) times daily as needed for cough. 15 capsule 0    butalbital-acetaminophen-caffeine (FIORICET) 50-325-40 MG tablet Take 1 tablet by mouth 2 (two) times daily as needed for headache. 14 tablet 0   Cholecalciferol (VITAMIN D3 PO) Take 1 capsule by mouth daily.     hydroxychloroquine (PLAQUENIL) 200 MG tablet Take 1 tablet by mouth 2 (two) times daily.     NALTREXONE HCL PO Take by mouth. 2.5 mg once daily     ondansetron (ZOFRAN) 4 MG tablet Take 1 tablet (4 mg total) by mouth 3 (three) times daily as needed for nausea or vomiting. 20 tablet 1   Probiotic Product (PROBIOTIC PO) Take 1 capsule by mouth daily.      RESTASIS 0.05 % ophthalmic emulsion 1 drop 2 (two) times daily.     VYVANSE 20 MG capsule Take 20 mg by mouth daily.     cyclobenzaprine (FLEXERIL) 5 MG tablet Take 1 tablet (5 mg total) by mouth 2 (two) times daily as needed for muscle spasms. (Patient not taking: Reported on 10/03/2023) 15 tablet 0   Ferrous Sulfate (IRON PO) Take by mouth. (Patient not taking: Reported on 10/03/2023)     No current facility-administered medications on file prior to visit.    BP 118/78   Pulse 82   Temp (!) 97.2 F (36.2 C) (Temporal)   Ht 5\' 7"  (1.702 m)   Wt 159 lb (72.1 kg)   SpO2 99%   BMI 24.90 kg/m  Objective:   Physical Exam Constitutional:      Appearance: She is ill-appearing.  HENT:     Right Ear: Tympanic membrane and ear canal normal.     Left Ear: Tympanic membrane and ear canal normal.     Nose: No mucosal edema.     Right Sinus: No maxillary sinus tenderness or frontal sinus tenderness.     Left Sinus: No maxillary sinus tenderness or frontal sinus tenderness.     Mouth/Throat:     Mouth: Mucous membranes are moist.  Eyes:     Conjunctiva/sclera: Conjunctivae normal.  Cardiovascular:     Rate and Rhythm: Normal rate and regular rhythm.  Pulmonary:     Effort: Pulmonary effort is normal.     Breath sounds: Normal breath sounds. No wheezing or rhonchi.     Comments: Dry cough noted several times during visit   Musculoskeletal:     Cervical back: Neck supple.  Skin:    General: Skin is warm and dry.           Assessment & Plan:  Acute cough Assessment & Plan: Continued without improvement.  Given duration of symptoms, coupled with presentation, will treat for presumed bacterial involvement.  Start Augmentin antibiotics. Take 1 tablet by mouth twice daily for 7 days. Prescription for fluconazole  150 mg tablet sent to pharmacy.  Continue Tessalon Perles as needed. Return precautions provided.  Orders: -     Amoxicillin-Pot Clavulanate; Take 1 tablet by mouth 2 (two) times daily.  Dispense: 14 tablet; Refill: 0  Antibiotic-induced yeast infection -     Fluconazole; Take 1 tablet (150 mg total) by mouth once for 1 dose.  Dispense: 1 tablet; Refill: 0        Doreene Nest, NP

## 2023-10-03 NOTE — Patient Instructions (Signed)
Start Augmentin antibiotics. Take 1 tablet by mouth twice daily for 7 days.  It was a pleasure to see you today!

## 2023-10-03 NOTE — Assessment & Plan Note (Signed)
Continued without improvement.  Given duration of symptoms, coupled with presentation, will treat for presumed bacterial involvement.  Start Augmentin antibiotics. Take 1 tablet by mouth twice daily for 7 days. Prescription for fluconazole 150 mg tablet sent to pharmacy.  Continue Tessalon Perles as needed. Return precautions provided.
